# Patient Record
Sex: Male | Born: 1955 | Race: White | Hispanic: No | Marital: Married | Smoking: Former smoker
Health system: Southern US, Community
[De-identification: ages and names within clinical notes are randomized; demographics above are authoritative.]

## PROBLEM LIST (undated history)

## (undated) DIAGNOSIS — G9520 Unspecified cord compression: Secondary | ICD-10-CM

## (undated) DIAGNOSIS — M5416 Radiculopathy, lumbar region: Secondary | ICD-10-CM

## (undated) DIAGNOSIS — K4090 Unilateral inguinal hernia, without obstruction or gangrene, not specified as recurrent: Secondary | ICD-10-CM

## (undated) DIAGNOSIS — Z8739 Personal history of other diseases of the musculoskeletal system and connective tissue: Secondary | ICD-10-CM

## (undated) DIAGNOSIS — I2510 Atherosclerotic heart disease of native coronary artery without angina pectoris: Secondary | ICD-10-CM

## (undated) DIAGNOSIS — R945 Abnormal results of liver function studies: Secondary | ICD-10-CM

## (undated) DIAGNOSIS — R809 Proteinuria, unspecified: Secondary | ICD-10-CM

## (undated) DIAGNOSIS — K635 Polyp of colon: Secondary | ICD-10-CM

## (undated) DIAGNOSIS — I10 Essential (primary) hypertension: Secondary | ICD-10-CM

## (undated) DIAGNOSIS — M48062 Spinal stenosis, lumbar region with neurogenic claudication: Secondary | ICD-10-CM

## (undated) DIAGNOSIS — M5442 Lumbago with sciatica, left side: Secondary | ICD-10-CM

## (undated) DIAGNOSIS — M4800 Spinal stenosis, site unspecified: Secondary | ICD-10-CM

## (undated) DIAGNOSIS — Z9989 Dependence on other enabling machines and devices: Secondary | ICD-10-CM

## (undated) DIAGNOSIS — E559 Vitamin D deficiency, unspecified: Secondary | ICD-10-CM

## (undated) DIAGNOSIS — M5117 Intervertebral disc disorders with radiculopathy, lumbosacral region: Secondary | ICD-10-CM

## (undated) DIAGNOSIS — M25562 Pain in left knee: Secondary | ICD-10-CM

## (undated) DIAGNOSIS — M4712 Other spondylosis with myelopathy, cervical region: Secondary | ICD-10-CM

## (undated) DIAGNOSIS — G8929 Other chronic pain: Secondary | ICD-10-CM

## (undated) DIAGNOSIS — M519 Unspecified thoracic, thoracolumbar and lumbosacral intervertebral disc disorder: Secondary | ICD-10-CM

## (undated) DIAGNOSIS — M1612 Unilateral primary osteoarthritis, left hip: Secondary | ICD-10-CM

## (undated) DIAGNOSIS — M19019 Primary osteoarthritis, unspecified shoulder: Secondary | ICD-10-CM

## (undated) DIAGNOSIS — E119 Type 2 diabetes mellitus without complications: Secondary | ICD-10-CM

## (undated) DIAGNOSIS — G4733 Obstructive sleep apnea (adult) (pediatric): Secondary | ICD-10-CM

## (undated) DIAGNOSIS — E669 Obesity, unspecified: Secondary | ICD-10-CM

## (undated) DIAGNOSIS — D229 Melanocytic nevi, unspecified: Secondary | ICD-10-CM

## (undated) DIAGNOSIS — M47816 Spondylosis without myelopathy or radiculopathy, lumbar region: Secondary | ICD-10-CM

## (undated) DIAGNOSIS — R200 Anesthesia of skin: Secondary | ICD-10-CM

## (undated) DIAGNOSIS — R296 Repeated falls: Secondary | ICD-10-CM

## (undated) DIAGNOSIS — G5603 Carpal tunnel syndrome, bilateral upper limbs: Secondary | ICD-10-CM

## (undated) DIAGNOSIS — E782 Mixed hyperlipidemia: Secondary | ICD-10-CM

## (undated) DIAGNOSIS — N200 Calculus of kidney: Secondary | ICD-10-CM

## (undated) DIAGNOSIS — I6521 Occlusion and stenosis of right carotid artery: Secondary | ICD-10-CM

## (undated) DIAGNOSIS — G629 Polyneuropathy, unspecified: Secondary | ICD-10-CM

## (undated) DIAGNOSIS — R37 Sexual dysfunction, unspecified: Secondary | ICD-10-CM

## (undated) HISTORY — DX: Unilateral primary osteoarthritis, left hip: M16.12

## (undated) HISTORY — DX: Radiculopathy, lumbar region: M54.16

## (undated) HISTORY — DX: Essential (primary) hypertension: I10

## (undated) HISTORY — PX: LUMBAR SPINE SURGERY: SHX701

## (undated) HISTORY — DX: Spondylosis without myelopathy or radiculopathy, lumbar region: M47.816

## (undated) HISTORY — DX: Primary osteoarthritis, unspecified shoulder: M19.019

## (undated) HISTORY — DX: Calculus of kidney: N20.0

## (undated) HISTORY — DX: Type 2 diabetes mellitus without complications: E11.9

## (undated) HISTORY — DX: Obstructive sleep apnea (adult) (pediatric): G47.33

## (undated) HISTORY — DX: Repeated falls: R29.6

## (undated) HISTORY — PX: HEMORRHOID SURGERY: SHX153

## (undated) HISTORY — DX: Spinal stenosis, site unspecified: M48.00

## (undated) HISTORY — DX: Mixed hyperlipidemia: E78.2

## (undated) HISTORY — DX: Lumbago with sciatica, left side: M54.42

## (undated) HISTORY — DX: Other spondylosis with myelopathy, cervical region: M47.12

## (undated) HISTORY — DX: Spinal stenosis, lumbar region with neurogenic claudication: M48.062

## (undated) HISTORY — DX: Occlusion and stenosis of right carotid artery: I65.21

## (undated) HISTORY — PX: CARPAL TUNNEL RELEASE: SHX101

## (undated) HISTORY — DX: Sexual dysfunction, unspecified: R37

## (undated) HISTORY — DX: Polyp of colon: K63.5

## (undated) HISTORY — PX: CERVICAL SPINE SURGERY: SHX589

## (undated) HISTORY — DX: Other chronic pain: G89.29

## (undated) HISTORY — DX: Melanocytic nevi, unspecified: D22.9

## (undated) HISTORY — DX: Unspecified cord compression: G95.20

## (undated) HISTORY — DX: Unspecified thoracic, thoracolumbar and lumbosacral intervertebral disc disorder: M51.9

## (undated) HISTORY — DX: Personal history of other diseases of the musculoskeletal system and connective tissue: Z87.39

## (undated) HISTORY — DX: Carpal tunnel syndrome, bilateral upper limbs: G56.03

## (undated) HISTORY — DX: Unilateral inguinal hernia, without obstruction or gangrene, not specified as recurrent: K40.90

## (undated) HISTORY — PX: PERIPHERAL NEUROPLASTY OF FINGER / HAND / ARM: SUR1022

## (undated) HISTORY — DX: Obesity, unspecified: E66.9

## (undated) HISTORY — DX: Atherosclerotic heart disease of native coronary artery without angina pectoris: I25.10

## (undated) HISTORY — DX: Intervertebral disc disorders with radiculopathy, lumbosacral region: M51.17

## (undated) HISTORY — DX: Abnormal results of liver function studies: R94.5

## (undated) HISTORY — DX: Polyneuropathy, unspecified: G62.9

## (undated) HISTORY — DX: Vitamin D deficiency, unspecified: E55.9

## (undated) HISTORY — DX: Pain in left knee: M25.562

## (undated) HISTORY — DX: Anesthesia of skin: R20.0

## (undated) HISTORY — DX: Dependence on other enabling machines and devices: Z99.89

## (undated) HISTORY — DX: Proteinuria, unspecified: R80.9

## (undated) HISTORY — PX: TONSILLECTOMY: SUR1361

---

## 2013-11-23 ENCOUNTER — Encounter: Payer: Self-pay | Admitting: Cardiology

## 2014-10-04 DIAGNOSIS — M5117 Intervertebral disc disorders with radiculopathy, lumbosacral region: Secondary | ICD-10-CM

## 2014-10-04 HISTORY — DX: Intervertebral disc disorders with radiculopathy, lumbosacral region: M51.17

## 2015-02-06 DIAGNOSIS — R200 Anesthesia of skin: Secondary | ICD-10-CM | POA: Insufficient documentation

## 2015-02-06 DIAGNOSIS — M4712 Other spondylosis with myelopathy, cervical region: Secondary | ICD-10-CM

## 2015-02-06 DIAGNOSIS — G9520 Unspecified cord compression: Secondary | ICD-10-CM | POA: Insufficient documentation

## 2015-02-06 DIAGNOSIS — I119 Hypertensive heart disease without heart failure: Secondary | ICD-10-CM

## 2015-02-06 DIAGNOSIS — I10 Essential (primary) hypertension: Secondary | ICD-10-CM | POA: Insufficient documentation

## 2015-02-06 HISTORY — DX: Anesthesia of skin: R20.0

## 2015-02-06 HISTORY — DX: Hypertensive heart disease without heart failure: I11.9

## 2015-02-06 HISTORY — DX: Other spondylosis with myelopathy, cervical region: M47.12

## 2015-02-06 HISTORY — DX: Essential (primary) hypertension: I10

## 2015-02-06 HISTORY — DX: Unspecified cord compression: G95.20

## 2015-03-13 DIAGNOSIS — E782 Mixed hyperlipidemia: Secondary | ICD-10-CM

## 2015-03-13 HISTORY — DX: Mixed hyperlipidemia: E78.2

## 2015-06-01 DIAGNOSIS — G5603 Carpal tunnel syndrome, bilateral upper limbs: Secondary | ICD-10-CM

## 2015-06-01 HISTORY — DX: Carpal tunnel syndrome, bilateral upper limbs: G56.03

## 2015-07-11 DIAGNOSIS — G8929 Other chronic pain: Secondary | ICD-10-CM | POA: Insufficient documentation

## 2015-07-11 DIAGNOSIS — M5442 Lumbago with sciatica, left side: Secondary | ICD-10-CM

## 2015-07-11 HISTORY — DX: Other chronic pain: G89.29

## 2015-12-25 DIAGNOSIS — M5416 Radiculopathy, lumbar region: Secondary | ICD-10-CM

## 2015-12-25 HISTORY — DX: Radiculopathy, lumbar region: M54.16

## 2016-04-10 DIAGNOSIS — E559 Vitamin D deficiency, unspecified: Secondary | ICD-10-CM

## 2016-04-10 DIAGNOSIS — E119 Type 2 diabetes mellitus without complications: Secondary | ICD-10-CM | POA: Insufficient documentation

## 2016-04-10 DIAGNOSIS — Z9989 Dependence on other enabling machines and devices: Secondary | ICD-10-CM

## 2016-04-10 DIAGNOSIS — G4733 Obstructive sleep apnea (adult) (pediatric): Secondary | ICD-10-CM

## 2016-04-10 HISTORY — DX: Vitamin D deficiency, unspecified: E55.9

## 2016-04-10 HISTORY — DX: Type 2 diabetes mellitus without complications: E11.9

## 2016-04-10 HISTORY — DX: Obstructive sleep apnea (adult) (pediatric): Z99.89

## 2016-04-10 HISTORY — DX: Obstructive sleep apnea (adult) (pediatric): G47.33

## 2016-04-29 DIAGNOSIS — M47816 Spondylosis without myelopathy or radiculopathy, lumbar region: Secondary | ICD-10-CM

## 2016-04-29 HISTORY — DX: Spondylosis without myelopathy or radiculopathy, lumbar region: M47.816

## 2016-10-15 DIAGNOSIS — R296 Repeated falls: Secondary | ICD-10-CM

## 2016-10-15 HISTORY — DX: Repeated falls: R29.6

## 2016-11-08 ENCOUNTER — Other Ambulatory Visit: Payer: Self-pay | Admitting: Orthopedic Surgery

## 2016-11-08 DIAGNOSIS — M4807 Spinal stenosis, lumbosacral region: Secondary | ICD-10-CM

## 2016-11-19 DIAGNOSIS — M1612 Unilateral primary osteoarthritis, left hip: Secondary | ICD-10-CM

## 2016-11-19 HISTORY — DX: Unilateral primary osteoarthritis, left hip: M16.12

## 2016-11-28 ENCOUNTER — Telehealth: Payer: Self-pay

## 2016-11-28 ENCOUNTER — Ambulatory Visit
Admission: RE | Admit: 2016-11-28 | Discharge: 2016-11-28 | Disposition: A | Discharge status: Home / Self Care | Payer: BLUE CROSS/BLUE SHIELD | Source: Ambulatory Visit | Attending: Orthopedic Surgery | Admitting: Orthopedic Surgery

## 2016-11-28 DIAGNOSIS — M4807 Spinal stenosis, lumbosacral region: Secondary | ICD-10-CM

## 2016-11-28 MED ORDER — IOPAMIDOL (ISOVUE-M 200) INJECTION 41%
15.0000 mL | Freq: Once | INTRAMUSCULAR | Status: AC
  Administered 2016-11-28: 15 mL via INTRATHECAL

## 2016-11-28 MED ORDER — DIAZEPAM 5 MG PO TABS
5.0000 mg | ORAL_TABLET | Freq: Once | ORAL | Status: AC
  Administered 2016-11-28: 5 mg via ORAL

## 2016-11-28 NOTE — Discharge Instructions (Signed)
Myelogram Discharge Instructions  1. Go home and rest quietly for the next 24 hours.  It is important to lie flat for the next 24 hours.  Get up only to go to the restroom.  You may lie in the bed or on a couch on your back, your stomach, your left side or your right side.  You may have one pillow under your head.  You may have pillows between your knees while you are on your side or under your knees while you are on your back.  2. DO NOT drive today.  Recline the seat as far back as it will go, while still wearing your seat belt, on the way home.  3. You may get up to go to the bathroom as needed.  You may sit up for 10 minutes to eat.  You may resume your normal diet and medications unless otherwise indicated.  Drink plenty of extra fluids today and tomorrow.  4. The incidence of a spinal headache with nausea and/or vomiting is about 5% (one in 20 patients).  If you develop a headache, lie flat and drink plenty of fluids until the headache goes away.  Caffeinated beverages may be helpful.  If you develop severe nausea and vomiting or a headache that does not go away with flat bed rest, call [phone].  5. You may resume normal activities after your 24 hours of bed rest is over; however, do not exert yourself strongly or do any heavy lifting tomorrow.  6. Call your physician for a follow-up appointment.    You may resume Duloxetine on Friday, November 29, 2016 after 11:30a.m.

## 2016-12-21 DIAGNOSIS — K635 Polyp of colon: Secondary | ICD-10-CM

## 2016-12-21 DIAGNOSIS — I6521 Occlusion and stenosis of right carotid artery: Secondary | ICD-10-CM

## 2016-12-21 DIAGNOSIS — N200 Calculus of kidney: Secondary | ICD-10-CM

## 2016-12-21 DIAGNOSIS — I2510 Atherosclerotic heart disease of native coronary artery without angina pectoris: Secondary | ICD-10-CM

## 2016-12-21 DIAGNOSIS — M48062 Spinal stenosis, lumbar region with neurogenic claudication: Secondary | ICD-10-CM

## 2016-12-21 DIAGNOSIS — B351 Tinea unguium: Secondary | ICD-10-CM | POA: Insufficient documentation

## 2016-12-21 DIAGNOSIS — M19019 Primary osteoarthritis, unspecified shoulder: Secondary | ICD-10-CM

## 2016-12-21 DIAGNOSIS — M4800 Spinal stenosis, site unspecified: Secondary | ICD-10-CM | POA: Insufficient documentation

## 2016-12-21 DIAGNOSIS — D229 Melanocytic nevi, unspecified: Secondary | ICD-10-CM

## 2016-12-21 HISTORY — DX: Spinal stenosis, lumbar region with neurogenic claudication: M48.062

## 2016-12-21 HISTORY — DX: Calculus of kidney: N20.0

## 2016-12-21 HISTORY — DX: Primary osteoarthritis, unspecified shoulder: M19.019

## 2016-12-21 HISTORY — DX: Atherosclerotic heart disease of native coronary artery without angina pectoris: I25.10

## 2016-12-21 HISTORY — DX: Polyp of colon: K63.5

## 2016-12-21 HISTORY — DX: Melanocytic nevi, unspecified: D22.9

## 2016-12-21 HISTORY — DX: Spinal stenosis, site unspecified: M48.00

## 2016-12-21 HISTORY — DX: Occlusion and stenosis of right carotid artery: I65.21

## 2016-12-23 DIAGNOSIS — K4090 Unilateral inguinal hernia, without obstruction or gangrene, not specified as recurrent: Secondary | ICD-10-CM

## 2016-12-23 HISTORY — DX: Unilateral inguinal hernia, without obstruction or gangrene, not specified as recurrent: K40.90

## 2017-07-08 DIAGNOSIS — M25562 Pain in left knee: Secondary | ICD-10-CM | POA: Insufficient documentation

## 2017-07-08 HISTORY — DX: Pain in left knee: M25.562

## 2017-08-19 DIAGNOSIS — G629 Polyneuropathy, unspecified: Secondary | ICD-10-CM

## 2017-08-19 HISTORY — DX: Polyneuropathy, unspecified: G62.9

## 2017-08-22 DIAGNOSIS — R945 Abnormal results of liver function studies: Secondary | ICD-10-CM

## 2017-08-22 DIAGNOSIS — R809 Proteinuria, unspecified: Secondary | ICD-10-CM | POA: Insufficient documentation

## 2017-08-22 DIAGNOSIS — R37 Sexual dysfunction, unspecified: Secondary | ICD-10-CM

## 2017-08-22 DIAGNOSIS — E669 Obesity, unspecified: Secondary | ICD-10-CM

## 2017-08-22 DIAGNOSIS — Z8739 Personal history of other diseases of the musculoskeletal system and connective tissue: Secondary | ICD-10-CM

## 2017-08-22 DIAGNOSIS — R7989 Other specified abnormal findings of blood chemistry: Secondary | ICD-10-CM

## 2017-08-22 DIAGNOSIS — M519 Unspecified thoracic, thoracolumbar and lumbosacral intervertebral disc disorder: Secondary | ICD-10-CM

## 2017-08-22 HISTORY — DX: Proteinuria, unspecified: R80.9

## 2017-08-22 HISTORY — DX: Other specified abnormal findings of blood chemistry: R79.89

## 2017-08-22 HISTORY — DX: Personal history of other diseases of the musculoskeletal system and connective tissue: Z87.39

## 2017-08-22 HISTORY — DX: Obesity, unspecified: E66.9

## 2017-08-22 HISTORY — DX: Unspecified thoracic, thoracolumbar and lumbosacral intervertebral disc disorder: M51.9

## 2017-08-22 HISTORY — DX: Sexual dysfunction, unspecified: R37

## 2017-09-08 NOTE — Progress Notes (Signed)
Cardiology Office Note:    Date:  09/09/2017   ID:  [name], DOB Nov 02, 1955, MRN [id number]  PCP:  Alma Friendly, MD  Cardiologist:  Shirlee More, MD   Referring MD: [facility]*  ASSESSMENT:    1. Coronary artery disease of native artery of native heart with stable angina pectoris ([place])   2. Hypertensive heart disease without heart failure   3. Aortic stenosis, mild   4. Carotid stenosis, right    PLAN:    In order of problems listed above:  1. I am unsure if he has an established diagnosis of CAD there is a disconnect between the records at [facility] patient and family and there is no record of coronary angiography or myocardial perfusion study at this time.  He is not having chest pain I asked him to continue aspirin statin and his current antihypertensives. 2. Stable blood pressure target continue his current ACE inhibitor diuretic 3. Physical examination suggest progression echocardiogram ordered. 4. Asymptomatic I asked him to add aspirin to his statin continue antihypertensives encouraged him to follow through and see vascular surgery  Next appointment 6 months   Medication Adjustments/Labs and Tests Ordered: Current medicines are reviewed at length with the patient today.  Concerns regarding medicines are outlined above.  Orders Placed This Encounter  Procedures  . EKG 12-Lead  . ECHOCARDIOGRAM COMPLETE   Meds ordered this encounter  Medications  . aspirin EC 81 MG tablet    Sig: Take 1 tablet (81 mg total) by mouth daily.    Dispense:  30 tablet    Refill:  3     Chief Complaint  Patient presents with  . Fatigue    History of Present Illness:    Zachary Andrews is a 62 y.o. male who is being seen today for the evaluation of heart disease at the request of [place]*. I reviewed the note from his primary care physician last month he had seen me many years ago and there is a question of whether he has coronary artery  disease.  He was seen by me at [facility] in September 2015.  At that time he troponin normal unstable angina pectoris and referred for coronary angiography report is unavailable.  His echocardiogram showed moderate left ventricular hypertrophy and trivial aortic stenosis and a carotid duplex showed greater than 70% right internal carotid artery stenosis.  His medical problems including hypertension hyperlipidemia and type 2 diabetes I am at a loss to reconcile the hospital records to his memory.  He tells me that he did not undergo heart catheterization or stress test and was simply discharged from the hospital.  Since then he has had no cardiac complaints no recurrent chest pain he had a recent carotid duplex showing severe stenosis of the right internal carotid artery is scheduled to be seen by vascular surgery [facility].  He is severely limited with low back pain walking perhaps 50-100 foot he has shortness of breath but he thinks is due to his severe back pain.  He has had no chest pain palpitations syncope or TIA.  He had been on aspirin recently discontinued because of bruising  Past Medical History:  Diagnosis Date  . Atypical nevi 12/21/2016  . Carpal tunnel syndrome, bilateral 06/01/2015  . Central spinal stenosis 12/21/2016  . Chronic bilateral low back pain with left-sided sciatica 07/11/2015  . Coronary artery disease involving native coronary artery of native heart 12/21/2016  . Elevated LFTs 08/22/2017  ICD-10 cut over  . Frequent falls 10/15/2016  . H/O spinal stenosis 08/22/2017  . Hypertension 02/06/2015  . 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 Lumbar disc disease 08/22/2017  . Lumbar radiculopathy 12/25/2015  . Lumbar spondylosis 04/29/2016  . Lumbar stenosis with neurogenic claudication 12/21/2016  . Microalbuminuria 08/22/2017  . Mixed hyperlipidemia 03/13/2015   Refusing statin  . Nephrolithiasis 12/21/2016  .  Neuropathy 08/19/2017   From cervical and lumbar spinal stenosis  And carpal tunnel  . Non-recurrent unilateral inguinal hernia without obstruction or gangrene 12/23/2016   2018  . Numbness 02/06/2015  . Obesity 08/22/2017  . Obstructive sleep apnea on CPAP 04/10/2016   Managed PULM  . Occlusion of right internal carotid artery 12/21/2016   >70%  . 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  . Osteoarthritis of shoulder 12/21/2016  . Pain in left knee 07/08/2017  . Polyp of colon 12/21/2016  . Primary osteoarthritis of left hip 11/19/2016  . Sexual dysfunction 08/22/2017  . Spinal cord compression ([place]) 02/06/2015  . Type 2 diabetes mellitus without complication, without long-term current use of insulin ([place]) 04/10/2016  . Vitamin D deficiency 04/10/2016    Past Surgical History:  Procedure Laterality Date  . CARPAL TUNNEL RELEASE Bilateral   . CERVICAL SPINE SURGERY    . HEMORRHOID SURGERY    . LUMBAR SPINE SURGERY     scraped nerves  . PERIPHERAL NEUROPLASTY OF FINGER / HAND / ARM Bilateral    transposition median nerve at carpal tunnel  . TONSILLECTOMY      Current Medications: Current Meds  Medication Sig  . Ascorbic Acid (VITAMIN C) 1000 MG tablet Take 2,000 mg by mouth daily.  Marland Kitchen lisinopril-hydrochlorothiazide (PRINZIDE,ZESTORETIC) 20-12.5 MG tablet Take 1 tablet by mouth daily.  . simvastatin (ZOCOR) 40 MG tablet Take 40 mg by mouth at bedtime.  . Vitamin D, Ergocalciferol, (DRISDOL) 50000 units CAPS capsule Take 50,000 Units by mouth every 7 (seven) days.     Allergies:   Bee venom; Atorvastatin; and Gabapentin   Social History   Socioeconomic History  . Marital status: Married    Spouse name: Not on file  . Number of children: Not on file  . Years of education: Not on file  . Highest education level: Not on file  Occupational History  . Not on file  Social Needs  . Financial resource strain: Not on file  . Food insecurity:    Worry: Not on  file    Inability: Not on file  . Transportation needs:    Medical: Not on file    Non-medical: Not on file  Tobacco Use  . Smoking status: Former Smoker    Packs/day: 1.00    Types: Cigarettes    Last attempt to quit: 11/25/2012    Years since quitting: 4.7  . Smokeless tobacco: Never Used  Substance and Sexual Activity  . Alcohol use: Not Currently  . Drug use: Not Currently  . Sexual activity: Not on file  Lifestyle  . Physical activity:    Days per week: Not on file    Minutes per session: Not on file  . Stress: Not on file  Relationships  . Social connections:    Talks on phone: Not on file    Gets together: Not on file    Attends religious service: Not on file    Active member of club or organization: Not on file    Attends meetings of clubs or organizations:  Not on file    Relationship status: Not on file  Other Topics Concern  . Not on file  Social History Narrative  . Not on file     Family History: The patient's family history includes Bone cancer in his mother; Congestive Heart Failure in his mother; Heart attack in his father; Leukemia in his brother.  ROS:   Review of Systems  Constitution: Positive for malaise/fatigue.  HENT: Negative.   Eyes: Negative.   Cardiovascular: Positive for dyspnea on exertion.  Respiratory: Positive for shortness of breath (he feels it is due to severe exertional back pain).   Endocrine: Negative.   Hematologic/Lymphatic: Negative.   Skin: Negative.   Musculoskeletal: Positive for back pain.  Gastrointestinal: Negative.   Genitourinary: Negative.   Neurological: Positive for dizziness.  Psychiatric/Behavioral: Negative.   Allergic/Immunologic: Negative.    Please see the history of present illness.     All other systems reviewed and are negative.  EKGs/Labs/Other Studies Reviewed:    The following studies were reviewed today:  EKG 02/22/2015 sinus rhythm and normal EKG:  EKG is  ordered today.  The ekg ordered  today demonstrates [facility] Chest x-ray 08/19/2017, no active disease  recent Labs: No results found for requested labs within last 8760 hours.  Recent Lipid Panel No results found for: CHOL, TRIG, HDL, CHOLHDL, VLDL, LDLCALC, LDLDIRECT  Physical Exam:    VS:  BP 102/62 (BP Location: Right Arm, Patient Position: Sitting, Cuff Size: Large)   Pulse 74   Ht 6\' 2"  (1.88 m)   Wt (!) 321 lb (145.6 kg)   SpO2 97%   BMI 41.21 kg/m     Wt Readings from Last 3 Encounters:  09/09/17 (!) 321 lb (145.6 kg)  11/11/16 (!) 310 lb (140.6 kg)     GEN:  Well nourished, well developed in no acute distress HEENT: Normal NECK: No JVD; No carotid bruits LYMPHATICS: No lymphadenopathy CARDIAC: 2/6 SEM harsh to the R clavicle S2 intactRRR, no murmurs, rubs, gallops RESPIRATORY:  Clear to auscultation without rales, wheezing or rhonchi  ABDOMEN: Soft, non-tender, non-distended MUSCULOSKELETAL:  No edema; No deformity  SKIN: Warm and dry NEUROLOGIC:  Alert and oriented x 3 PSYCHIATRIC:  Normal affect     Signed, Shirlee More, MD  09/09/2017 2:29 PM    [place]

## 2017-09-09 ENCOUNTER — Encounter: Payer: Self-pay | Admitting: Cardiology

## 2017-09-09 ENCOUNTER — Ambulatory Visit (INDEPENDENT_AMBULATORY_CARE_PROVIDER_SITE_OTHER): Payer: Medicaid Other | Admitting: Cardiology

## 2017-09-09 VITALS — BP 102/62 | HR 74 | Ht 74.0 in | Wt 321.0 lb

## 2017-09-09 DIAGNOSIS — I25118 Atherosclerotic heart disease of native coronary artery with other forms of angina pectoris: Secondary | ICD-10-CM

## 2017-09-09 DIAGNOSIS — I350 Nonrheumatic aortic (valve) stenosis: Secondary | ICD-10-CM | POA: Insufficient documentation

## 2017-09-09 DIAGNOSIS — I119 Hypertensive heart disease without heart failure: Secondary | ICD-10-CM | POA: Diagnosis not present

## 2017-09-09 DIAGNOSIS — I6521 Occlusion and stenosis of right carotid artery: Secondary | ICD-10-CM

## 2017-09-09 DIAGNOSIS — M47818 Spondylosis without myelopathy or radiculopathy, sacral and sacrococcygeal region: Secondary | ICD-10-CM | POA: Insufficient documentation

## 2017-09-09 DIAGNOSIS — M461 Sacroiliitis, not elsewhere classified: Secondary | ICD-10-CM

## 2017-09-09 HISTORY — DX: Nonrheumatic aortic (valve) stenosis: I35.0

## 2017-09-09 HISTORY — DX: Sacroiliitis, not elsewhere classified: M46.1

## 2017-09-09 HISTORY — DX: Occlusion and stenosis of right carotid artery: I65.21

## 2017-09-09 MED ORDER — ASPIRIN EC 81 MG PO TBEC: 81.0000 mg | DELAYED_RELEASE_TABLET | Freq: Every day | ORAL | 3 refills | Status: DC

## 2017-09-09 NOTE — Patient Instructions (Signed)
Medication Instructions:  Your physician has recommended you make the following change in your medication:   START: Aspirin enteric coated 81mg  take one tablet daily   Labwork: NONE  Testing/Procedures: You had an EKG today  Your physician has requested that you have an echocardiogram. Echocardiography is a painless test that uses sound waves to create images of your heart. It provides your doctor with information about the size and shape of your heart and how well your heart's chambers and valves are working. This procedure takes approximately one hour. There are no restrictions for this procedure.    Follow-Up: Your physician wants you to follow-up in: 6 months. You will receive a reminder letter in the mail two months in advance. If you don't receive a letter, please call our office to schedule the follow-up appointment.   Any Other Special Instructions Will Be Listed Below (If Applicable).     If you need a refill on your cardiac medications before your next appointment, please call your pharmacy.

## 2017-09-10 DIAGNOSIS — N182 Chronic kidney disease, stage 2 (mild): Secondary | ICD-10-CM | POA: Insufficient documentation

## 2017-09-10 HISTORY — DX: Chronic kidney disease, stage 2 (mild): N18.2

## 2017-10-09 ENCOUNTER — Other Ambulatory Visit: Payer: Medicaid Other

## 2018-02-06 ENCOUNTER — Other Ambulatory Visit: Payer: Self-pay | Admitting: Orthopedic Surgery

## 2018-02-06 ENCOUNTER — Telehealth: Payer: Self-pay

## 2018-02-06 DIAGNOSIS — G8929 Other chronic pain: Secondary | ICD-10-CM

## 2018-02-06 DIAGNOSIS — M545 Low back pain, unspecified: Secondary | ICD-10-CM

## 2018-02-06 NOTE — Telephone Encounter (Signed)
Spoke with patient to screen his medications and drug allergies.  No medications to stop.  Brita Romp, RN

## 2018-02-19 ENCOUNTER — Ambulatory Visit
Admission: RE | Admit: 2018-02-19 | Discharge: 2018-02-19 | Disposition: A | Discharge status: Home / Self Care | Payer: Medicaid Other | Source: Ambulatory Visit | Attending: Orthopedic Surgery | Admitting: Orthopedic Surgery

## 2018-02-19 DIAGNOSIS — M545 Low back pain, unspecified: Secondary | ICD-10-CM

## 2018-02-19 DIAGNOSIS — G8929 Other chronic pain: Secondary | ICD-10-CM

## 2018-02-19 MED ORDER — ONDANSETRON HCL 4 MG/2ML IJ SOLN: 4.0000 mg | Freq: Four times a day (QID) | INTRAMUSCULAR | Status: DC | PRN

## 2018-02-19 MED ORDER — DIAZEPAM 5 MG PO TABS
10.0000 mg | ORAL_TABLET | Freq: Once | ORAL | Status: AC
  Administered 2018-02-19: 5 mg via ORAL

## 2018-02-19 MED ORDER — IOPAMIDOL (ISOVUE-M 200) INJECTION 41%
15.0000 mL | Freq: Once | INTRAMUSCULAR | Status: AC
  Administered 2018-02-19: 15 mL via INTRATHECAL

## 2018-02-19 NOTE — Discharge Instructions (Signed)

## 2018-04-10 DIAGNOSIS — H5347 Heteronymous bilateral field defects: Secondary | ICD-10-CM

## 2018-04-10 HISTORY — DX: Heteronymous bilateral field defects: H53.47

## 2018-04-20 DIAGNOSIS — H903 Sensorineural hearing loss, bilateral: Secondary | ICD-10-CM

## 2018-04-20 DIAGNOSIS — H9313 Tinnitus, bilateral: Secondary | ICD-10-CM

## 2018-04-20 HISTORY — DX: Sensorineural hearing loss, bilateral: H90.3

## 2018-04-20 HISTORY — DX: Tinnitus, bilateral: H93.13

## 2019-01-12 DIAGNOSIS — R0989 Other specified symptoms and signs involving the circulatory and respiratory systems: Secondary | ICD-10-CM

## 2019-01-12 HISTORY — DX: Other specified symptoms and signs involving the circulatory and respiratory systems: R09.89

## 2019-04-11 NOTE — Progress Notes (Deleted)
Cardiology Office Note:    Date:  04/11/2019   ID:  Zachary Andrews, DOB May 30, 1955, MRN [id number]  PCP:  Alma Friendly, MD  Cardiologist:  Shirlee More, MD    Referring MD: Alma Friendly, MD    ASSESSMENT:    No diagnosis found. PLAN:    In order of problems listed above:  1. ***   Next appointment: ***   Medication Adjustments/Labs and Tests Ordered: Current medicines are reviewed at length with the patient today.  Concerns regarding medicines are outlined above.  No orders of the defined types were placed in this encounter.  No orders of the defined types were placed in this encounter.   No chief complaint on file.   History of Present Illness:    Zachary Andrews is a 64 y.o. male with a hx of CAD mild AS hypertension and R ICA stenosis.He was last seen 09/09/2017.He was seen by me at [facility] in September 2015.  At that time he troponin normal unstable angina pectoris and referred for coronary angiography report is unavailable.  His echocardiogram showed moderate left ventricular hypertrophy and trivial aortic stenosis and a carotid duplex showed greater than 70% right internal carotid artery stenosis.  His medical problems including hypertension hyperlipidemia and type 2 diabetes   Compliance with diet, lifestyle and medications: *** Past Medical History:  Diagnosis Date  . Atypical nevi 12/21/2016  . Carpal tunnel syndrome, bilateral 06/01/2015  . Central spinal stenosis 12/21/2016  . Chronic bilateral low back pain with left-sided sciatica 07/11/2015  . Coronary artery disease involving native coronary artery of native heart 12/21/2016  . Elevated LFTs 08/22/2017   ICD-10 cut over  . Frequent falls 10/15/2016  . H/O spinal stenosis 08/22/2017  . Hypertension 02/06/2015  . 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 Lumbar disc disease 08/22/2017  . Lumbar radiculopathy 12/25/2015  . Lumbar spondylosis 04/29/2016  .  Lumbar stenosis with neurogenic claudication 12/21/2016  . Microalbuminuria 08/22/2017  . Mixed hyperlipidemia 03/13/2015   Refusing statin  . Nephrolithiasis 12/21/2016  . Neuropathy 08/19/2017   From cervical and lumbar spinal stenosis  And carpal tunnel  . Non-recurrent unilateral inguinal hernia without obstruction or gangrene 12/23/2016   2018  . Numbness 02/06/2015  . Obesity 08/22/2017  . Obstructive sleep apnea on CPAP 04/10/2016   Managed PULM  . Occlusion of right internal carotid artery 12/21/2016   >70%  . 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  . Osteoarthritis of shoulder 12/21/2016  . Pain in left knee 07/08/2017  . Polyp of colon 12/21/2016  . Primary osteoarthritis of left hip 11/19/2016  . Sexual dysfunction 08/22/2017  . Spinal cord compression ([place]) 02/06/2015  . Type 2 diabetes mellitus without complication, without long-term current use of insulin ([place]) 04/10/2016  . Vitamin D deficiency 04/10/2016    Past Surgical History:  Procedure Laterality Date  . CARPAL TUNNEL RELEASE Bilateral   . CERVICAL SPINE SURGERY    . HEMORRHOID SURGERY    . LUMBAR SPINE SURGERY     scraped nerves  . PERIPHERAL NEUROPLASTY OF FINGER / HAND / ARM Bilateral    transposition median nerve at carpal tunnel  . TONSILLECTOMY      Current Medications: No outpatient medications have been marked as taking for the 04/13/19 encounter (Appointment) with Richardo Priest, MD.     Allergies:   Bee venom, Atorvastatin, and Gabapentin   Social History   Socioeconomic History  . Marital status: Married  Spouse name: Not on file  . Number of children: Not on file  . Years of education: Not on file  . Highest education level: Not on file  Occupational History  . Not on file  Tobacco Use  . Smoking status: Former Smoker    Packs/day: 1.00    Types: Cigarettes    Quit date: 11/25/2012    Years since quitting: 6.3  . Smokeless tobacco: Never Used  Substance and  Sexual Activity  . Alcohol use: Not Currently  . Drug use: Not Currently  . Sexual activity: Not on file  Other Topics Concern  . Not on file  Social History Narrative  . Not on file   Social Determinants of Health   Financial Resource Strain:   . Difficulty of Paying Living Expenses: Not on file  Food Insecurity:   . Worried About Charity fundraiser in the Last Year: Not on file  . Ran Out of Food in the Last Year: Not on file  Transportation Needs:   . Lack of Transportation (Medical): Not on file  . Lack of Transportation (Non-Medical): Not on file  Physical Activity:   . Days of Exercise per Week: Not on file  . Minutes of Exercise per Session: Not on file  Stress:   . Feeling of Stress : Not on file  Social Connections:   . Frequency of Communication with Friends and Family: Not on file  . Frequency of Social Gatherings with Friends and Family: Not on file  . Attends Religious Services: Not on file  . Active Member of Clubs or Organizations: Not on file  . Attends Archivist Meetings: Not on file  . Marital Status: Not on file     Family History: The patient's ***family history includes Bone cancer in his mother; Congestive Heart Failure in his mother; Heart attack in his father; Leukemia in his brother. ROS:   Please see the history of present illness.    All other systems reviewed and are negative.  EKGs/Labs/Other Studies Reviewed:    The following studies were reviewed today:  EKG:  EKG ordered today and personally reviewed.  The ekg ordered today demonstrates ***  Recent Labs: No results found for requested labs within last 8760 hours.  Recent Lipid Panel No results found for: CHOL, TRIG, HDL, CHOLHDL, VLDL, LDLCALC, LDLDIRECT  Physical Exam:    VS:  There were no vitals taken for this visit.    Wt Readings from Last 3 Encounters:  09/09/17 (!) 321 lb (145.6 kg)  11/11/16 (!) 310 lb (140.6 kg)     GEN: *** Well nourished, well developed  in no acute distress HEENT: Normal NECK: No JVD; No carotid bruits LYMPHATICS: No lymphadenopathy CARDIAC: ***RRR, no murmurs, rubs, gallops RESPIRATORY:  Clear to auscultation without rales, wheezing or rhonchi  ABDOMEN: Soft, non-tender, non-distended MUSCULOSKELETAL:  No edema; No deformity  SKIN: Warm and dry NEUROLOGIC:  Alert and oriented x 3 PSYCHIATRIC:  Normal affect    Signed, Shirlee More, MD  04/11/2019 2:17 PM    [facility]

## 2019-04-13 ENCOUNTER — Ambulatory Visit: Payer: Medicaid Other | Admitting: Cardiology

## 2019-05-23 NOTE — Progress Notes (Deleted)
Cardiology Office Note:    Date:  05/23/2019   ID:  Zachary Andrews, DOB 1955/03/14, MRN [id number]  PCP:  Alma Friendly, MD  Cardiologist:  Shirlee More, MD    Referring MD: Alma Friendly, MD    ASSESSMENT:    No diagnosis found. PLAN:    In order of problems listed above:  1. ***   Next appointment: ***   Medication Adjustments/Labs and Tests Ordered: Current medicines are reviewed at length with the patient today.  Concerns regarding medicines are outlined above.  No orders of the defined types were placed in this encounter.  No orders of the defined types were placed in this encounter.   No chief complaint on file.   History of Present Illness:    Zachary Andrews is a 64 y.o. male with a hx of chest pain last seen 09/09/2017.He was seen by me at [facility] in September 2015. At that time he troponin normal unstable angina pectoris and referred for coronary angiography report is unavailable. His echocardiogram showed moderate left ventricular hypertrophy and trivial aortic stenosis and a carotid duplex showed greater than 70% right internal carotid artery stenosis. His medical problems including hypertension hyperlipidemia and type 2 diabetes   Compliance with diet, lifestyle and medications: *** Past Medical History:  Diagnosis Date  . Atypical nevi 12/21/2016  . Carpal tunnel syndrome, bilateral 06/01/2015  . Central spinal stenosis 12/21/2016  . Chronic bilateral low back pain with left-sided sciatica 07/11/2015  . Coronary artery disease involving native coronary artery of native heart 12/21/2016  . Elevated LFTs 08/22/2017   ICD-10 cut over  . Frequent falls 10/15/2016  . H/O spinal stenosis 08/22/2017  . Hypertension 02/06/2015  . 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 Lumbar disc disease 08/22/2017  . Lumbar radiculopathy 12/25/2015  . Lumbar spondylosis 04/29/2016  . Lumbar stenosis with neurogenic claudication  12/21/2016  . Microalbuminuria 08/22/2017  . Mixed hyperlipidemia 03/13/2015   Refusing statin  . Nephrolithiasis 12/21/2016  . Neuropathy 08/19/2017   From cervical and lumbar spinal stenosis  And carpal tunnel  . Non-recurrent unilateral inguinal hernia without obstruction or gangrene 12/23/2016   2018  . Numbness 02/06/2015  . Obesity 08/22/2017  . Obstructive sleep apnea on CPAP 04/10/2016   Managed PULM  . Occlusion of right internal carotid artery 12/21/2016   >70%  . 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  . Osteoarthritis of shoulder 12/21/2016  . Pain in left knee 07/08/2017  . Polyp of colon 12/21/2016  . Primary osteoarthritis of left hip 11/19/2016  . Sexual dysfunction 08/22/2017  . Spinal cord compression ([place]) 02/06/2015  . Type 2 diabetes mellitus without complication, without long-term current use of insulin ([place]) 04/10/2016  . Vitamin D deficiency 04/10/2016    Past Surgical History:  Procedure Laterality Date  . CARPAL TUNNEL RELEASE Bilateral   . CERVICAL SPINE SURGERY    . HEMORRHOID SURGERY    . LUMBAR SPINE SURGERY     scraped nerves  . PERIPHERAL NEUROPLASTY OF FINGER / HAND / ARM Bilateral    transposition median nerve at carpal tunnel  . TONSILLECTOMY      Current Medications: No outpatient medications have been marked as taking for the 05/24/19 encounter (Appointment) with Richardo Priest, MD.     Allergies:   Bee venom, Atorvastatin calcium, Metformin, Pravastatin, Rosuvastatin, Atorvastatin, and Gabapentin   Social History   Socioeconomic History  . Marital status: Married    Spouse name: Not  on file  . Number of children: Not on file  . Years of education: Not on file  . Highest education level: Not on file  Occupational History  . Not on file  Tobacco Use  . Smoking status: Former Smoker    Packs/day: 1.00    Types: Cigarettes    Quit date: 11/25/2012    Years since quitting: 6.4  . Smokeless tobacco: Never Used    Substance and Sexual Activity  . Alcohol use: Not Currently  . Drug use: Not Currently  . Sexual activity: Not on file  Other Topics Concern  . Not on file  Social History Narrative  . Not on file   Social Determinants of Health   Financial Resource Strain:   . Difficulty of Paying Living Expenses:   Food Insecurity:   . Worried About Charity fundraiser in the Last Year:   . Arboriculturist in the Last Year:   Transportation Needs:   . Film/video editor (Medical):   Marland Kitchen Lack of Transportation (Non-Medical):   Physical Activity:   . Days of Exercise per Week:   . Minutes of Exercise per Session:   Stress:   . Feeling of Stress :   Social Connections:   . Frequency of Communication with Friends and Family:   . Frequency of Social Gatherings with Friends and Family:   . Attends Religious Services:   . Active Member of Clubs or Organizations:   . Attends Archivist Meetings:   Marland Kitchen Marital Status:      Family History: The patient's ***family history includes Bone cancer in his mother; Congestive Heart Failure in his mother; Heart attack in his father; Leukemia in his brother. ROS:   Please see the history of present illness.    All other systems reviewed and are negative.  EKGs/Labs/Other Studies Reviewed:    The following studies were reviewed today:  EKG:  EKG ordered today and personally reviewed.  The ekg ordered today demonstrates ***  Recent Labs: No results found for requested labs within last 8760 hours.  Recent Lipid Panel No results found for: CHOL, TRIG, HDL, CHOLHDL, VLDL, LDLCALC, LDLDIRECT  Physical Exam:    VS:  There were no vitals taken for this visit.    Wt Readings from Last 3 Encounters:  09/09/17 (!) 321 lb (145.6 kg)  11/11/16 (!) 310 lb (140.6 kg)     GEN: *** Well nourished, well developed in no acute distress HEENT: Normal NECK: No JVD; No carotid bruits LYMPHATICS: No lymphadenopathy CARDIAC: ***RRR, no murmurs, rubs,  gallops RESPIRATORY:  Clear to auscultation without rales, wheezing or rhonchi  ABDOMEN: Soft, non-tender, non-distended MUSCULOSKELETAL:  No edema; No deformity  SKIN: Warm and dry NEUROLOGIC:  Alert and oriented x 3 PSYCHIATRIC:  Normal affect    Signed, Shirlee More, MD  05/23/2019 6:38 PM    [facility]

## 2019-05-24 ENCOUNTER — Ambulatory Visit: Payer: Medicaid Other | Admitting: Cardiology

## 2019-06-14 NOTE — Progress Notes (Signed)
Cardiology Office Note:    Date:  06/15/2019   ID:  Zachary Andrews, DOB 01/03/1956, MRN [id number]  PCP:  Algis Greenhouse, MD  Cardiologist:  Shirlee More, MD    Referring MD: Alma Friendly, MD    ASSESSMENT:    1. Coronary artery disease of native artery of native heart with stable angina pectoris ([place])   2. Hypertensive heart disease without heart failure   3. Carotid stenosis, right   4. Aortic stenosis, mild   5. Mixed hyperlipidemia    PLAN:    In order of problems listed above:  1. Stable CAD at this time do not think it requires an ischemia evaluation continue aspirin antihypertensive and switch to PCSK9 inhibitors absolutely statin intolerant. 2. Worsened he is taking SGLT2 inhibitor and is noticed his blood pressures are 110 or less and is lightheaded will reduce his antihypertensive by 50% 3. Vascular surgery has occlusion of his right internal carotid artery 4. Echocardiogram ordered with aortic stenosis by diabetes progressed by physical exam 5. Trial of PCSK9   Next appointment: 3 months   Medication Adjustments/Labs and Tests Ordered: Current medicines are reviewed at length with the patient today.  Concerns regarding medicines are outlined above.  No orders of the defined types were placed in this encounter.  No orders of the defined types were placed in this encounter.   Chief Complaint  Patient presents with  . Follow-up    Carotid artery stenosis  . Coronary Artery Disease  . Aortic Stenosis    History of Present Illness:    Zachary Andrews is a 64 y.o. male with a hx of CAD and carotid stenosis last seen 09/09/2017.  Compliance with diet, lifestyle and medications: Yes  He relates to me he had no intervention done at the time of his coronary angiogram and is not having angina edema orthopnea palpitation or syncope.  He struggles walking especially uphill and relates much of it to muscle pain neuropathy and chronic back pain.  He does have exertional  shortness of breath but is not limiting.  He is intolerant of simvastatin and tolerates none of the statins and is taking over-the-counter red rice yeast for the last week.  We discussed PCSK9 inhibitor he is interested in we will place him on Repatha as he is absolutely statin intolerant and very high risk with this vascular disease.  He was seen by me at [facility] in September 2015.  At that time he troponin normal unstable angina pectoris and referred for coronary angiography report is unavailable.  His echocardiogram showed moderate left ventricular hypertrophy and trivial aortic stenosis and a carotid duplex showed greater than 70% right internal carotid artery stenosis.  He has had a right carotid endarterectomy. His medical problems including hypertension hyperlipidemia and type 2 diabetes.  He also has right internal carotid artery stenosis followed by vascular surgery.  Vascular duplex 01/08/2019: Result Impression  Procedure: This exam consists of 2D gray scale imaging, color Doppler,  and pulsed wave Doppler analysis of the carotid arteries. . Indication: Follow up s/p right carotid endarterectomy and known  occluded on 01/08/2018. Marland Kitchen Previous: . Right: No audible flow in the ICA, occluded. Increased velocities in  the ECA, PSV 345.72/EDV42.65. Marland Kitchen Left: No significant focal increase in ICA velocities.  Conclusions:  Right: The internal carotid artery is occluded, S/P CEA. Increased  velocities in the ECA. Left: No hemodynamically significant ICA stenosis, consistent with <60%.  Compared to previous exam, these results remain essentially unchanged.  Past Medical History:  Diagnosis Date  . Atypical nevi 12/21/2016  . Carpal tunnel syndrome, bilateral 06/01/2015  . Central spinal stenosis 12/21/2016  . Chronic bilateral low back pain with left-sided sciatica 07/11/2015  . Coronary artery disease involving native coronary artery of native heart 12/21/2016  . Elevated  LFTs 08/22/2017   ICD-10 cut over  . Frequent falls 10/15/2016  . H/O spinal stenosis 08/22/2017  . Hypertension 02/06/2015  . 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 Lumbar disc disease 08/22/2017  . Lumbar radiculopathy 12/25/2015  . Lumbar spondylosis 04/29/2016  . Lumbar stenosis with neurogenic claudication 12/21/2016  . Microalbuminuria 08/22/2017  . Mixed hyperlipidemia 03/13/2015   Refusing statin  . Nephrolithiasis 12/21/2016  . Neuropathy 08/19/2017   From cervical and lumbar spinal stenosis  And carpal tunnel  . Non-recurrent unilateral inguinal hernia without obstruction or gangrene 12/23/2016   2018  . Numbness 02/06/2015  . Obesity 08/22/2017  . Obstructive sleep apnea on CPAP 04/10/2016   Managed PULM  . Occlusion of right internal carotid artery 12/21/2016   >70%  . 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  . Osteoarthritis of shoulder 12/21/2016  . Pain in left knee 07/08/2017  . Polyp of colon 12/21/2016  . Primary osteoarthritis of left hip 11/19/2016  . Sexual dysfunction 08/22/2017  . Spinal cord compression ([place]) 02/06/2015  . Type 2 diabetes mellitus without complication, without long-term current use of insulin ([place]) 04/10/2016  . Vitamin D deficiency 04/10/2016    Past Surgical History:  Procedure Laterality Date  . CARPAL TUNNEL RELEASE Bilateral   . CERVICAL SPINE SURGERY    . HEMORRHOID SURGERY    . LUMBAR SPINE SURGERY     scraped nerves  . PERIPHERAL NEUROPLASTY OF FINGER / HAND / ARM Bilateral    transposition median nerve at carpal tunnel  . TONSILLECTOMY      Current Medications: Current Meds  Medication Sig  . Ascorbic Acid (VITAMIN C) 1000 MG tablet Take 2,000 mg by mouth daily.  Marland Kitchen aspirin EC 81 MG tablet Take 1 tablet (81 mg total) by mouth daily.  . carboxymethylcellulose 1 % ophthalmic solution Apply to eye.  . Cholecalciferol 25 MCG (1000 UT) capsule Take 1,000 Units by mouth  daily.  . dapagliflozin propanediol (FARXIGA) 10 MG TABS tablet 10 mg daily.  . fluticasone (FLONASE) 50 MCG/ACT nasal spray 1 spray by Both Nostrils route daily as needed for Rhinitis.  Marland Kitchen lisinopril-hydrochlorothiazide (PRINZIDE,ZESTORETIC) 20-12.5 MG tablet Take 1 tablet by mouth daily.  . Red Yeast Rice Extract (RED YEAST RICE PO) Take by mouth in the morning and at bedtime.  . [DISCONTINUED] Vitamin D, Ergocalciferol, (DRISDOL) 50000 units CAPS capsule Take 50,000 Units by mouth every 7 (seven) days.     Allergies:   Bee venom, Atorvastatin calcium, Metformin, Pravastatin, Rosuvastatin, Atorvastatin, and Gabapentin   Social History   Socioeconomic History  . Marital status: Married    Spouse name: Not on file  . Number of children: Not on file  . Years of education: Not on file  . Highest education level: Not on file  Occupational History  . Not on file  Tobacco Use  . Smoking status: Former Smoker    Packs/day: 1.00    Types: Cigarettes    Quit date: 11/25/2012    Years since quitting: 6.5  . Smokeless tobacco: Never Used  Substance and Sexual Activity  . Alcohol use: Not Currently  . Drug use: Not Currently  .  Sexual activity: Not on file  Other Topics Concern  . Not on file  Social History Narrative  . Not on file   Social Determinants of Health   Financial Resource Strain:   . Difficulty of Paying Living Expenses:   Food Insecurity:   . Worried About Charity fundraiser in the Last Year:   . Arboriculturist in the Last Year:   Transportation Needs:   . Film/video editor (Medical):   Marland Kitchen Lack of Transportation (Non-Medical):   Physical Activity:   . Days of Exercise per Week:   . Minutes of Exercise per Session:   Stress:   . Feeling of Stress :   Social Connections:   . Frequency of Communication with Friends and Family:   . Frequency of Social Gatherings with Friends and Family:   . Attends Religious Services:   . Active Member of Clubs or  Organizations:   . Attends Archivist Meetings:   Marland Kitchen Marital Status:      Family History: The patient's family history includes Bone cancer in his mother; Congestive Heart Failure in his mother; Heart attack in his father; Leukemia in his brother. ROS:   Please see the history of present illness.    All other systems reviewed and are negative.  EKGs/Labs/Other Studies Reviewed:    The following studies were reviewed today:  EKG:  EKG ordered today and personally reviewed.  The ekg ordered today demonstrates sinus rhythm rightward axis otherwise normal  Recent Labs: 04/08/2019 cholesterol 190 LDL 147 that was on simvastatin that he had to stop because of muscle symptoms triglycerides 105 HDL 30 potassium 4.6 creatinine 1.19 GFR 65 cc  Physical Exam:    VS:  BP 92/60 (BP Location: Left Arm, Patient Position: Sitting, Cuff Size: Normal)   Pulse 67   Ht 6\' 2"  (1.88 m)   Wt (!) 307 lb (139.3 kg)   SpO2 96%   BMI 39.42 kg/m     Wt Readings from Last 3 Encounters:  06/15/19 (!) 307 lb (139.3 kg)  09/09/17 (!) 321 lb (145.6 kg)  11/11/16 (!) 310 lb (140.6 kg)    Blood pressure by me 90/60 sitting and standing right upper extremity GEN:  Well nourished, well developed in no acute distress HEENT: Normal NECK: No JVD; No carotid bruits LYMPHATICS: No lymphadenopathy CARDIAC: RRR, no murmurs, rubs, gallops RESPIRATORY:  Clear to auscultation without rales, wheezing or rhonchi  ABDOMEN: Soft, non-tender, non-distended MUSCULOSKELETAL:  No edema; No deformity  SKIN: Warm and dry NEUROLOGIC:  Alert and oriented x 3 PSYCHIATRIC:  Normal affect    Signed, Shirlee More, MD  06/15/2019 2:18 PM    [facility]

## 2019-06-15 ENCOUNTER — Other Ambulatory Visit: Payer: Self-pay | Admitting: Cardiology

## 2019-06-15 ENCOUNTER — Other Ambulatory Visit: Payer: Self-pay

## 2019-06-15 ENCOUNTER — Ambulatory Visit (INDEPENDENT_AMBULATORY_CARE_PROVIDER_SITE_OTHER): Payer: Medicaid Other | Admitting: Cardiology

## 2019-06-15 ENCOUNTER — Encounter: Payer: Self-pay | Admitting: Cardiology

## 2019-06-15 VITALS — BP 92/60 | HR 67 | Ht 74.0 in | Wt 307.0 lb

## 2019-06-15 DIAGNOSIS — E782 Mixed hyperlipidemia: Secondary | ICD-10-CM

## 2019-06-15 DIAGNOSIS — I119 Hypertensive heart disease without heart failure: Secondary | ICD-10-CM | POA: Diagnosis not present

## 2019-06-15 DIAGNOSIS — I350 Nonrheumatic aortic (valve) stenosis: Secondary | ICD-10-CM

## 2019-06-15 DIAGNOSIS — I25118 Atherosclerotic heart disease of native coronary artery with other forms of angina pectoris: Secondary | ICD-10-CM | POA: Diagnosis not present

## 2019-06-15 DIAGNOSIS — I6521 Occlusion and stenosis of right carotid artery: Secondary | ICD-10-CM | POA: Diagnosis not present

## 2019-06-15 MED ORDER — REPATHA SURECLICK 140 MG/ML ~~LOC~~ SOAJ: 1.0000 "pen " | SUBCUTANEOUS | 6 refills | Status: AC

## 2019-06-15 NOTE — Patient Instructions (Signed)
Medication Instructions:  Your physician has recommended you make the following change in your medication:  DECREASE: Lisinopril/HCTZ to 0.5 mg daily. START: Repatha 140 mg inject into the skin once every two weeks.  *If you need a refill on your cardiac medications before your next appointment, please call your pharmacy*   Lab Work: None If you have labs (blood work) drawn today and your tests are completely normal, you will receive your results only by: Marland Kitchen MyChart Message (if you have MyChart) OR . A paper copy in the mail If you have any lab test that is abnormal or we need to change your treatment, we will call you to review the results.   Testing/Procedures: Your physician has requested that you have an echocardiogram. Echocardiography is a painless test that uses sound waves to create images of your heart. It provides your doctor with information about the size and shape of your heart and how well your heart's chambers and valves are working. This procedure takes approximately one hour. There are no restrictions for this procedure.     Follow-Up: At [facility], you and your health needs are our priority.  As part of our continuing mission to provide you with exceptional heart care, we have created designated Provider Care Teams.  These Care Teams include your primary Cardiologist (physician) and Advanced Practice Providers (APPs -  Physician Assistants and Nurse Practitioners) who all work together to provide you with the care you need, when you need it.  We recommend signing up for the patient portal called "MyChart".  Sign up information is provided on this After Visit Summary.  MyChart is used to connect with patients for Virtual Visits (Telemedicine).  Patients are able to view lab/test results, encounter notes, upcoming appointments, etc.  Non-urgent messages can be sent to your provider as well.   To learn more about what you can do with MyChart, go to [email or web address].     Your next appointment:   3 month(s)  The format for your next appointment:   In Person  Provider:   Shirlee More, MD   Other Instructions We have put in a referral for you to see the lipid clinic. They will call you to schedule this appointment.

## 2019-07-04 IMAGING — CT CT L SPINE W/ CM
2 of 7 series · 5 of 14 positions shown, 6 images · non-contrast
Comparison: 11/28/2016

CLINICAL DATA: Chronic low back pain. Leg pain and weakness. Assess
for change since last year.
TECHNIQUE: Contiguous axial images were obtained through the Lumbar spine after
the intrathecal infusion of infusion. Coronal and sagittal
reconstructions were obtained of the axial image sets.

[Series 3: l spine soft · axial · 0.44mm/px · z∈[-227,-140]mm · 2 of 88 slices shown]
[im 30/88  soft-tissue]
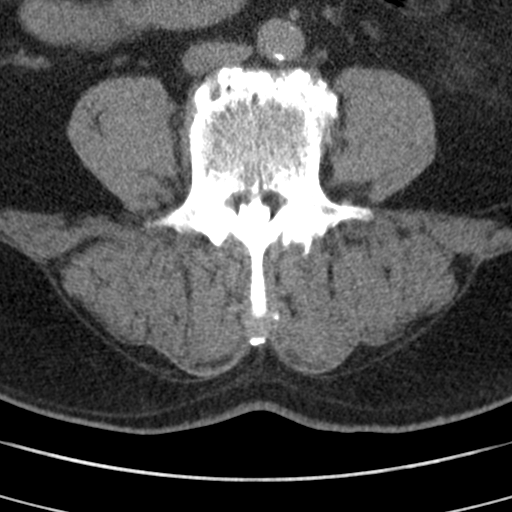
[im 59/88  soft-tissue]
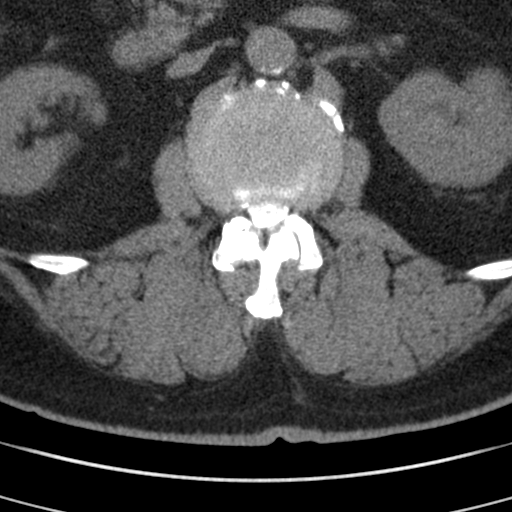

[Series 4: l spine bone · axial · 0.32mm/px · z∈[-254,-128]mm · 3 of 84 slices shown, 4 images]
[im 21/84  soft-tissue]
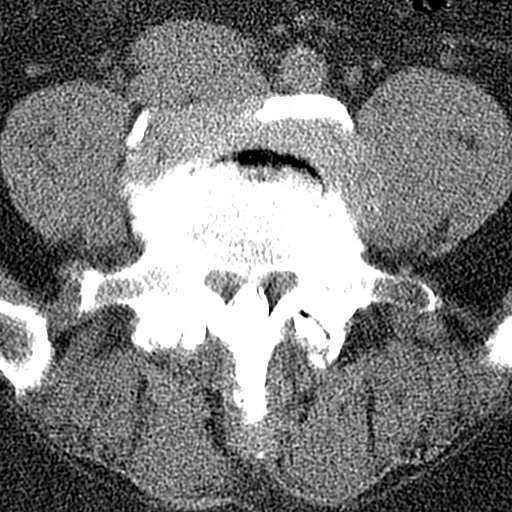
[im 21/84  bone]
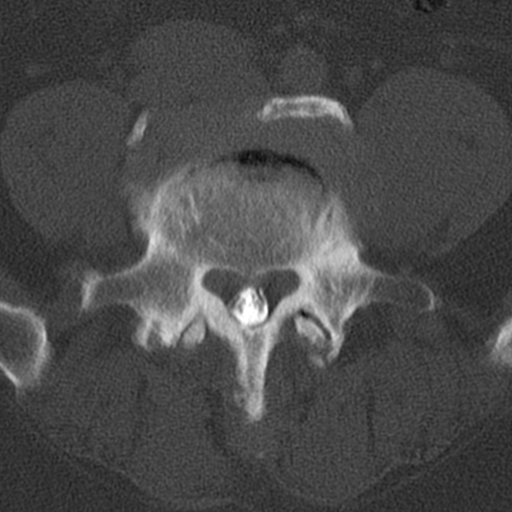
[im 42/84  bone]
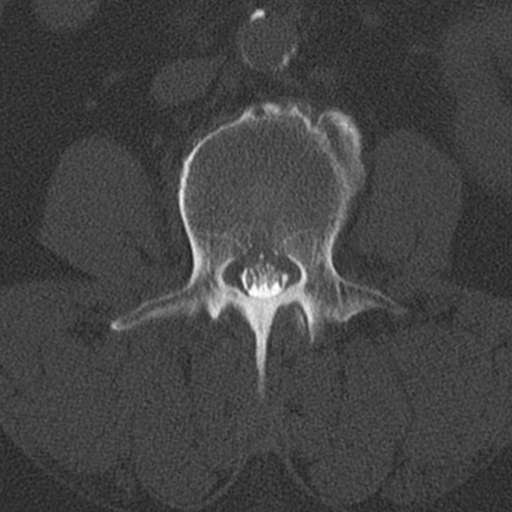
[im 63/84  bone]
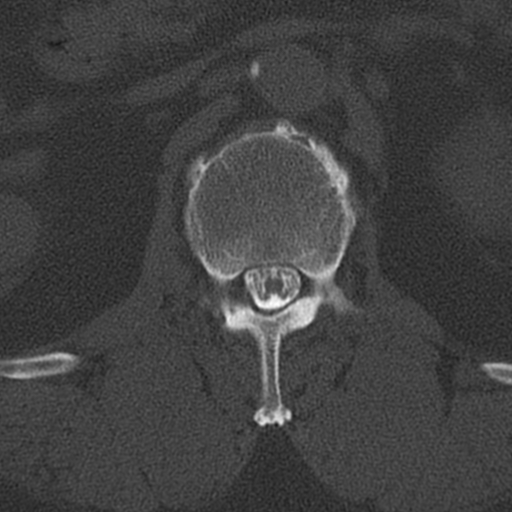

[5 of 14 positions shown; findings below may reference images not displayed]

EXAM:
LUMBAR MYELOGRAM

FLUOROSCOPY TIME:  0 minutes 35 seconds. 268.97 micro gray meter
squared

PROCEDURE:
After thorough discussion of risks and benefits of the procedure
including bleeding, infection, injury to nerves, blood vessels,
adjacent structures as well as headache and CSF leak, written and
oral informed consent was obtained. Consent was obtained by Dr. Tiger
Tsunemori. Time out form was completed.

Patient was positioned prone on the fluoroscopy table. Local
anesthesia was provided with 1% lidocaine without epinephrine after
prepped and draped in the usual sterile fashion. Puncture was
performed at L5-S1 using a 5 inch 22-gauge spinal needle via right
paramedian approach. Using a single pass through the dura, the
needle was placed within the thecal sac, with return of clear CSF.
15 mL of Isovue [id number] was injected into the thecal sac, with normal
opacification of the nerve roots and cauda equina consistent with
free flow within the subarachnoid space.

I personally performed the lumbar puncture and administered the
intrathecal contrast. I also personally performed acquisition of the
myelogram images.
FINDINGS: LUMBAR MYELOGRAM FINDINGS:

There are anterior extradural defects throughout the lumbar region
from L1-2 through L4-5. There is lateral recess narrowing at L2-3,
L3-4 and L4-5. No compression of the thecal sac or S1 root sleeves
seen at L5-S1. Standing flexion extension view show 2 mm of
anterolisthesis at L4-5.

CT LUMBAR MYELOGRAM FINDINGS:

T11-12: Minimal disc bulge. Mild facet hypertrophy. No compressive
stenosis.

T12-L1: Minimal disc bulge. Mild facet hypertrophy. No compressive
stenosis.

L1-2: Moderate disc bulge. Mild facet and ligamentous hypertrophy.
Mild stenosis but without discrete neural compression. Similar
appearance to the previous exam.

L2-3: Moderate disc bulge. Facet and ligamentous hypertrophy.
Stenosis of the lateral recesses and foramina, right more than left.
Similar appearance to the previous study.

L3-4: Moderate disc bulge. Facet and ligamentous hypertrophy.
Stenosis of the lateral recesses and foramina. Similar appearance to
the previous study.

L4-5: Previous right hemilaminectomy. Endplate osteophytes and
bulging of the disc. Facet degeneration and hypertrophy with gaping
joints. Stenosis of the lateral recesses and neural foramina,
similar to the previous study.

L5-S1: Endplate osteophytes and bulging of the disc. Mild facet
hypertrophy. No central canal stenosis. No S1 nerve compression is
seen. Bilateral foraminal stenosis that could compress either or
both L5 nerves. Similar appearance to the previous study.
IMPRESSION: No change detected compared to last year. Degenerative disc disease
throughout the lumbar spine with endplate osteophytes and disc
bulges from L1-2 through L5-S1. No compressive central canal
stenosis at any level. Bilateral lateral recess narrowing that could
cause neural compression from L2-3 through L4-5. Bilateral foraminal
narrowing that could cause neural compression from L2-3 through
L5-S1. Previous partial right hemilaminectomy at L4-5. Foraminal
stenosis particularly pronounced at L4-5 and L5-S1.

## 2019-07-07 ENCOUNTER — Other Ambulatory Visit: Payer: Self-pay

## 2019-07-07 ENCOUNTER — Ambulatory Visit (INDEPENDENT_AMBULATORY_CARE_PROVIDER_SITE_OTHER): Payer: Medicaid Other

## 2019-07-07 DIAGNOSIS — I350 Nonrheumatic aortic (valve) stenosis: Secondary | ICD-10-CM | POA: Diagnosis not present

## 2019-07-07 NOTE — Progress Notes (Unsigned)
Complete echocardiogram hs been performed.  Jimmy Eloni Darius RDCS, RVT

## 2019-07-08 ENCOUNTER — Telehealth: Payer: Self-pay

## 2019-07-08 NOTE — Telephone Encounter (Signed)
Spoke with patient regarding results and recommendation.  Patient verbalizes understanding and is agreeable to plan of care. Advised patient to call back with any issues or concerns.  

## 2019-08-03 DIAGNOSIS — M7070 Other bursitis of hip, unspecified hip: Secondary | ICD-10-CM

## 2019-08-03 HISTORY — DX: Other bursitis of hip, unspecified hip: M70.70

## 2019-09-16 ENCOUNTER — Telehealth: Payer: Self-pay | Admitting: Cardiology

## 2019-09-16 DIAGNOSIS — Z20822 Contact with and (suspected) exposure to covid-19: Secondary | ICD-10-CM | POA: Insufficient documentation

## 2019-09-16 DIAGNOSIS — I959 Hypotension, unspecified: Secondary | ICD-10-CM

## 2019-09-16 DIAGNOSIS — N289 Disorder of kidney and ureter, unspecified: Secondary | ICD-10-CM | POA: Insufficient documentation

## 2019-09-16 HISTORY — DX: Hypotension, unspecified: I95.9

## 2019-09-16 HISTORY — DX: Contact with and (suspected) exposure to covid-19: Z20.822

## 2019-09-16 HISTORY — DX: Disorder of kidney and ureter, unspecified: N28.9

## 2019-09-16 NOTE — Telephone Encounter (Signed)
Pt's wife called and said that the pt was at [facility] and wanted to talk with a nurse about what was said. Please call back

## 2019-09-16 NOTE — Telephone Encounter (Signed)
Pts wife calling to inform Dr. Bettina Gavia that he was admitted and discharged from [facility] for RUQ pain and elevated D-Dimer. No PE was found. Hospital records have not been released, so she was asked to call [facility] to send reports to Dr. Bettina Gavia prior to his OV next week. She was concerned his echo in the hospital showed aortic stenosis. After reviewing his last echo in May 2021, his aortic stenosis and dilation was identified and being followed by Dr. Bettina Gavia. I advised her this would likely be addressed during his visit next week.  She had no further concerns at this time.

## 2019-09-20 NOTE — Progress Notes (Signed)
Cardiology Office Note:    Date:  09/21/2019   ID:  Zachary Andrews, DOB 07-19-1955, MRN [id number]  PCP:  Algis Greenhouse, MD  Cardiologist:  Shirlee More, MD    Referring MD: Algis Greenhouse, MD    ASSESSMENT:    1. Coronary artery disease of native artery of native heart with stable angina pectoris ([place])   2. Hypertensive heart disease without heart failure   3. Nonrheumatic aortic valve stenosis   4. Mixed hyperlipidemia   5. Carotid stenosis, right    PLAN:    In order of problems listed above:  1. He had a recent presentation with what is described as right chest right upper quadrant pain with a pleuritic component was evaluated including a CTA of the chest records are unavailable. 2. BP at target continue current antihypertensives 3. Await records including recent echocardiogram he be seen back in the office in the next 2 to 3 days to pull things together 4. He is taking over-the-counter statin will reassess at his office visit 5. He has not scheduled follow-up with vascular surgery or from a carotid duplex in view of his previous CVA and chronic right internal carotid artery stenosis   Next appointment: Next 2 to 3 days after records are received   Medication Adjustments/Labs and Tests Ordered: Current medicines are reviewed at length with the patient today.  Concerns regarding medicines are outlined above.  No orders of the defined types were placed in this encounter.  No orders of the defined types were placed in this encounter.   Chief Complaint  Patient presents with  . Follow-up    He is admitted to [facility] last week with chest pain discharge Thursday comes to see me in follow-up    History of Present Illness:    Zachary Andrews is a 64 y.o. male with a hx of coronary artery disease hypertensive heart disease right carotid stenosis with previous endarterectomy and with chronic total occlusion moderate aortic stenosis and hyperlipidemia last seen  06/15/2019.  [facility] records show that in 2015 he had troponin normal was felt to be unstable angina and was referred to [facility] hospital for coronary angiography no intervention was performed and we are unable to access that report.    Compliance with diet, lifestyle and medications: Yes  He had a recent discharge from [facility] last Thursday was seen by cardiologist sounds like he either had a cardiac CTA or a myocardial perfusion study had an echocardiogram told he had a heart murmur and was set up to follow with the group from [facility] but came back to my office.  Patient and his wife were left with the impression that I had his medical records.  Echocardiogram 2015 [facility] health had aortic valve thickening and had a minimal gradient although today be described as aortic valve sclerosis peak velocity 2.2 m/s squared.  The discharge handout the patient has from the hospital mentions aortic stenosis we are going to be unable to access his records today and he will be scheduled to come back to the office in 2 to 3 days to follow-up after I can review his hospital records.  During this visit I phoned his primary care physician and neither of [place] can access the records from [facility] in real-time. Past Medical History:  Diagnosis Date  . Atypical nevi 12/21/2016  . Carpal tunnel syndrome, bilateral 06/01/2015  . Central spinal stenosis 12/21/2016  . Chronic bilateral low back  pain with left-sided sciatica 07/11/2015  . Coronary artery disease involving native coronary artery of native heart 12/21/2016  . Elevated LFTs 08/22/2017   ICD-10 cut over  . Frequent falls 10/15/2016  . H/O spinal stenosis 08/22/2017  . Hypertension 02/06/2015  . 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 Lumbar disc disease 08/22/2017  . Lumbar radiculopathy 12/25/2015  . Lumbar spondylosis 04/29/2016  . Lumbar stenosis with neurogenic  claudication 12/21/2016  . Microalbuminuria 08/22/2017  . Mixed hyperlipidemia 03/13/2015   Refusing statin  . Nephrolithiasis 12/21/2016  . Neuropathy 08/19/2017   From cervical and lumbar spinal stenosis  And carpal tunnel  . Non-recurrent unilateral inguinal hernia without obstruction or gangrene 12/23/2016   2018  . Numbness 02/06/2015  . Obesity 08/22/2017  . Obstructive sleep apnea on CPAP 04/10/2016   Managed PULM  . Occlusion of right internal carotid artery 12/21/2016   >70%  . 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  . Osteoarthritis of shoulder 12/21/2016  . Pain in left knee 07/08/2017  . Polyp of colon 12/21/2016  . Primary osteoarthritis of left hip 11/19/2016  . Sexual dysfunction 08/22/2017  . Spinal cord compression ([place]) 02/06/2015  . Type 2 diabetes mellitus without complication, without long-term current use of insulin ([place]) 04/10/2016  . Vitamin D deficiency 04/10/2016    Past Surgical History:  Procedure Laterality Date  . CARPAL TUNNEL RELEASE Bilateral   . CERVICAL SPINE SURGERY    . HEMORRHOID SURGERY    . LUMBAR SPINE SURGERY     scraped nerves  . PERIPHERAL NEUROPLASTY OF FINGER / HAND / ARM Bilateral    transposition median nerve at carpal tunnel  . TONSILLECTOMY      Current Medications: Current Meds  Medication Sig  . Ascorbic Acid (VITAMIN C) 1000 MG tablet Take 2,000 mg by mouth daily.  Marland Kitchen aspirin EC 81 MG tablet Take 1 tablet (81 mg total) by mouth daily.  . carboxymethylcellulose 1 % ophthalmic solution Apply to eye.  . Cholecalciferol 25 MCG (1000 UT) capsule Take 1,000 Units by mouth daily.  . dapagliflozin propanediol (FARXIGA) 10 MG TABS tablet 10 mg daily.  . Evolocumab (REPATHA SURECLICK) 737 MG/ML SOAJ Inject 1 pen into the skin every 14 (fourteen) days.  . fluticasone (FLONASE) 50 MCG/ACT nasal spray 1 spray by Both Nostrils route daily as needed for Rhinitis.  Marland Kitchen lisinopril-hydrochlorothiazide (PRINZIDE,ZESTORETIC)  20-12.5 MG tablet Take 0.5 tablets by mouth daily.  . Red Yeast Rice Extract (RED YEAST RICE PO) Take by mouth in the morning and at bedtime.     Allergies:   Bee venom, Atorvastatin calcium, Metformin, Pravastatin, Rosuvastatin, Atorvastatin, and Gabapentin   Social History   Socioeconomic History  . Marital status: Married    Spouse name: Not on file  . Number of children: Not on file  . Years of education: Not on file  . Highest education level: Not on file  Occupational History  . Not on file  Tobacco Use  . Smoking status: Former Smoker    Packs/day: 1.00    Types: Cigarettes    Quit date: 11/25/2012    Years since quitting: 6.8  . Smokeless tobacco: Never Used  Vaping Use  . Vaping Use: Never used  Substance and Sexual Activity  . Alcohol use: Not Currently  . Drug use: Not Currently  . Sexual activity: Not on file  Other Topics Concern  . Not on file  Social History Narrative  . Not on file  Social Determinants of Health   Financial Resource Strain:   . Difficulty of Paying Living Expenses:   Food Insecurity:   . Worried About Charity fundraiser in the Last Year:   . Arboriculturist in the Last Year:   Transportation Needs:   . Film/video editor (Medical):   Marland Kitchen Lack of Transportation (Non-Medical):   Physical Activity:   . Days of Exercise per Week:   . Minutes of Exercise per Session:   Stress:   . Feeling of Stress :   Social Connections:   . Frequency of Communication with Friends and Family:   . Frequency of Social Gatherings with Friends and Family:   . Attends Religious Services:   . Active Member of Clubs or Organizations:   . Attends Archivist Meetings:   Marland Kitchen Marital Status:      Family History: The patient's family history includes Bone cancer in his mother; Congestive Heart Failure in his mother; Heart attack in his father; Leukemia in his brother. ROS:   Please see the history of present illness.    All other systems  reviewed and are negative.  EKGs/Labs/Other Studies Reviewed:    The following studies were reviewed today:    Physical Exam:    VS:  BP 112/66 (BP Location: Right Arm, Patient Position: Sitting, Cuff Size: Normal)   Pulse 66   Ht 6\' 2"  (1.88 m)   Wt (!) 305 lb 12.8 oz (138.7 kg)   SpO2 98%   BMI 39.26 kg/m     Wt Readings from Last 3 Encounters:  09/21/19 (!) 305 lb 12.8 oz (138.7 kg)  06/15/19 (!) 307 lb (139.3 kg)  09/09/17 (!) 321 lb (145.6 kg)     GEN: Obese  Well nourished, well developed in no acute distress HEENT: Normal NECK: No JVD; No carotid bruits LYMPHATICS: No lymphadenopathy CARDIAC: Is a grade 1/6 to 2/6 systolic ejection murmur aortic area but they do have the left to start outssmpti text RRR, no murmurs, rubs, gallops RESPIRATORY:  Clear to auscultation without rales, wheezing or rhonchi  ABDOMEN: Soft, non-tender, non-distended MUSCULOSKELETAL:  No edema; No deformity  SKIN: Warm and dry NEUROLOGIC:  Alert and oriented x 3 PSYCHIATRIC:  Normal affect    Signed, Shirlee More, MD  09/21/2019 8:20 AM    [facility]

## 2019-09-21 ENCOUNTER — Ambulatory Visit (INDEPENDENT_AMBULATORY_CARE_PROVIDER_SITE_OTHER): Payer: Medicaid Other | Admitting: Cardiology

## 2019-09-21 ENCOUNTER — Encounter: Payer: Self-pay | Admitting: Cardiology

## 2019-09-21 ENCOUNTER — Other Ambulatory Visit: Payer: Self-pay

## 2019-09-21 VITALS — BP 112/66 | HR 66 | Ht 74.0 in | Wt 305.8 lb

## 2019-09-21 DIAGNOSIS — E119 Type 2 diabetes mellitus without complications: Secondary | ICD-10-CM

## 2019-09-21 DIAGNOSIS — E782 Mixed hyperlipidemia: Secondary | ICD-10-CM

## 2019-09-21 DIAGNOSIS — I6521 Occlusion and stenosis of right carotid artery: Secondary | ICD-10-CM

## 2019-09-21 DIAGNOSIS — I119 Hypertensive heart disease without heart failure: Secondary | ICD-10-CM | POA: Diagnosis not present

## 2019-09-21 DIAGNOSIS — I350 Nonrheumatic aortic (valve) stenosis: Secondary | ICD-10-CM

## 2019-09-21 DIAGNOSIS — Z86711 Personal history of pulmonary embolism: Secondary | ICD-10-CM | POA: Insufficient documentation

## 2019-09-21 DIAGNOSIS — I25118 Atherosclerotic heart disease of native coronary artery with other forms of angina pectoris: Secondary | ICD-10-CM

## 2019-09-21 DIAGNOSIS — I2699 Other pulmonary embolism without acute cor pulmonale: Secondary | ICD-10-CM

## 2019-09-21 DIAGNOSIS — G9520 Unspecified cord compression: Secondary | ICD-10-CM

## 2019-09-21 HISTORY — DX: Other pulmonary embolism without acute cor pulmonale: I26.99

## 2019-09-21 NOTE — Patient Instructions (Signed)
Medication Instructions:  Your physician recommends that you continue on your current medications as directed. Please refer to the Current Medication list given to you today.  *If you need a refill on your cardiac medications before your next appointment, please call your pharmacy*   Lab Work: None If you have labs (blood work) drawn today and your tests are completely normal, you will receive your results only by:  [place] (if you have MyChart) OR  A paper copy in the mail If you have any lab test that is abnormal or we need to change your treatment, we will call you to review the results.   Testing/Procedures: None   Follow-Up: At [facility], you and your health needs are our priority.  As part of our continuing mission to provide you with exceptional heart care, we have created designated Provider Care Teams.  These Care Teams include your primary Cardiologist (physician) and Advanced Practice Providers (APPs -  Physician Assistants and Nurse Practitioners) who all work together to provide you with the care you need, when you need it.  We recommend signing up for the patient portal called "MyChart".  Sign up information is provided on this After Visit Summary.  MyChart is used to connect with patients for Virtual Visits (Telemedicine).  Patients are able to view lab/test results, encounter notes, upcoming appointments, etc.  Non-urgent messages can be sent to your provider as well.   To learn more about what you can do with MyChart, go to [email or web address].    Your next appointment:   09/23/19 AT 8:20 AM  The format for your next appointment:   In Person  Provider:   Shirlee More, MD   Other Instructions

## 2019-09-23 ENCOUNTER — Encounter: Payer: Self-pay | Admitting: Cardiology

## 2019-09-23 ENCOUNTER — Ambulatory Visit (INDEPENDENT_AMBULATORY_CARE_PROVIDER_SITE_OTHER): Payer: Medicaid Other | Admitting: Cardiology

## 2019-09-23 ENCOUNTER — Other Ambulatory Visit: Payer: Self-pay

## 2019-09-23 VITALS — HR 62 | Ht 74.0 in | Wt 312.6 lb

## 2019-09-23 DIAGNOSIS — I25118 Atherosclerotic heart disease of native coronary artery with other forms of angina pectoris: Secondary | ICD-10-CM | POA: Diagnosis not present

## 2019-09-23 DIAGNOSIS — E782 Mixed hyperlipidemia: Secondary | ICD-10-CM | POA: Diagnosis not present

## 2019-09-23 DIAGNOSIS — I350 Nonrheumatic aortic (valve) stenosis: Secondary | ICD-10-CM | POA: Diagnosis not present

## 2019-09-23 DIAGNOSIS — I6521 Occlusion and stenosis of right carotid artery: Secondary | ICD-10-CM

## 2019-09-23 DIAGNOSIS — I119 Hypertensive heart disease without heart failure: Secondary | ICD-10-CM

## 2019-09-23 LAB — BASIC METABOLIC PANEL
BUN/Creatinine Ratio: 21 (ref 10–24)
BUN: 21 mg/dL (ref 8–27)
CO2: 27 mmol/L (ref 20–29)
Calcium: 9.4 mg/dL (ref 8.6–10.2)
Chloride: 104 mmol/L (ref 96–106)
Creatinine, Ser: 1.02 mg/dL (ref 0.76–1.27)
GFR calc Af Amer: 90 mL/min/{1.73_m2} (ref >59)
GFR calc non Af Amer: 78 mL/min/{1.73_m2} (ref >59)
Glucose: 148 mg/dL — ABNORMAL HIGH (ref 65–99)
Potassium: 4.2 mmol/L (ref 3.5–5.2)
Sodium: 139 mmol/L (ref 134–144)

## 2019-09-23 MED ORDER — AMOXICILLIN 500 MG PO CAPS: 2000.0000 mg | ORAL_CAPSULE | Freq: Every day | ORAL | 0 refills | Status: DC | PRN

## 2019-09-23 NOTE — Patient Instructions (Addendum)
Medication Instructions:  Your physician recommends that you continue on your current medications as directed. Please refer to the Current Medication list given to you today.  *If you need a refill on your cardiac medications before your next appointment, please call your pharmacy*   Lab Work: Your physician recommends that you return for lab work in: TODAY BMP If you have labs (blood work) drawn today and your tests are completely normal, you will receive your results only by: Marland Kitchen MyChart Message (if you have MyChart) OR . A paper copy in the mail If you have any lab test that is abnormal or we need to change your treatment, we will call you to review the results.   Testing/Procedures: Your physician has requested that you have an echocardiogram in 6 months. Echocardiography is a painless test that uses sound waves to create images of your heart. It provides your doctor with information about the size and shape of your heart and how well your heart's chambers and valves are working. This procedure takes approximately one hour. There are no restrictions for this procedure.     [facility] [address] [place],  [address] Phone:  [phone]    Please arrive 15 minutes prior to your appointment time for registration and insurance purposes.  The test will take approximately 3 to 4 hours to complete; you may bring reading material.  If someone comes with you to your appointment, they will need to remain in the main lobby due to limited space in the testing area. **If you are pregnant or breastfeeding, please notify the nuclear lab prior to your appointment**  How to prepare for your Myocardial Perfusion Test: . Do not eat or drink 3 hours prior to your test, except you may have water. . Do not consume products containing caffeine (regular or decaffeinated) 12 hours prior to your test. (ex: coffee, chocolate, sodas, tea). . Do bring a list of your  current medications with you.  If not listed below, you may take your medications as normal. . HOLD diabetic medication/insulin the morning of the test: [place] . Do wear comfortable clothes (no dresses or overalls) and walking shoes, tennis shoes preferred (No heels or open toe shoes are allowed). . Do NOT wear cologne, perfume, aftershave, or lotions (deodorant is allowed). . If these instructions are not followed, your test will have to be rescheduled.  Please report to [address] for your test.  If you have questions or concerns about your appointment, you can call the [facility] at [phone].  If you cannot keep your appointment, please provide 24 hours notification to the Nuclear Lab, to avoid a possible $50 charge to your account.   Follow-Up: At [facility], you and your health needs are our priority.  As part of our continuing mission to provide you with exceptional heart care, we have created designated Provider Care Teams.  These Care Teams include your primary Cardiologist (physician) and Advanced Practice Providers (APPs -  Physician Assistants and Nurse Practitioners) who all work together to provide you with the care you need, when you need it.  We recommend signing up for the patient portal called "MyChart".  Sign up information is provided on this After Visit Summary.  MyChart is used to connect with patients for Virtual Visits (Telemedicine).  Patients are able to view lab/test results, encounter notes, upcoming appointments, etc.  Non-urgent messages can be sent to your provider as well.  To learn more about what you can do with MyChart, go to [email or web address].     Your next appointment:   3 month(s)  The format for your next appointment:   In Person  Provider:   Shirlee More, MD   Other Instructions

## 2019-09-23 NOTE — Progress Notes (Signed)
Cardiology Office Note:    Date:  09/23/2019   ID:  Zachary Andrews, DOB 1956/01/28, MRN [id number]  PCP:  Zachary Greenhouse, MD  Cardiologist:  Zachary More, MD    Referring MD: Zachary Greenhouse, MD    ASSESSMENT:    1. Nonrheumatic aortic valve stenosis   2. Hypertensive heart disease without heart failure   3. Coronary artery disease of native artery of native heart with stable angina pectoris ([place])   4. Mixed hyperlipidemia   5. Occlusion of right internal carotid artery    PLAN:    In order of problems listed above:  1. His aortic stenosis from our echocardiogram in May was mild to moderate we will recheck in 6 months and see him in the office and start endocarditis prophylaxis amoxicillin 2 g 1 hour prior to dental procedures 2. Stable continue current antihypertensives 3. Check myocardial perfusion study 4. Continue over-the-counter statin plus Repatha 5. Stable on most recent duplex   Next appointment: 3 months   Medication Adjustments/Labs and Tests Ordered: Current medicines are reviewed at length with the patient today.  Concerns regarding medicines are outlined above.  Orders Placed This Encounter  Procedures  . Basic Metabolic Panel (BMET)   No orders of the defined types were placed in this encounter.   No chief complaint on file.   History of Present Illness:    Zachary Andrews is a 64 y.o. male with a hx of coronary artery disease hypertensive heart disease right carotid stenosis with previous endarterectomy and with chronic total occlusion moderate aortic stenosis and hyperlipidemia.[facility] show that in 2015 he had troponin normal was felt to be unstable angina and was referred to [facility] hospital for coronary angiography no intervention was performed and we are unable to access that report.  Echocardiogram in our office 07/16/2019 was remarkably similar with a mean gradient of 20 and a valve area 1.4 cm.  He also had enlargement  ascending aorta 40 mm.  He was seen 09/21/2019 particular at the direction of the physicians at [facility] for follow-up from recent hospitalization.  Unfortunately are not on epic and no records were able to be obtained.  Subsequently I received his records from the hospitalization 09/14/2019 to 09/15/2019 he is admitted with shortness of breath and right-sided pleuritic chest pain and with a clinical concern that he had experienced pulmonary embolism.  His D-dimer is significantly elevated twice upper limits of normal by their assay chest x-ray unremarkable CTA was performed that unfortunately was nondiagnostic due to missed timing of bolus lung and vascular structures were normal duplex of the lower extremities showed no evidence of deep vein thrombosis and decision was made not to pursue the diagnosis of venous thromboembolism further.  Other testing included serial troponins high-sensitivity that were low proBNP level was low CBC renal function were normal.  He had an echocardiogram performed unable to review it as of today it showed the presence of moderate concentric LVH normal left ventricular global and regional systolic function EF 55 to 60% the aortic valve was mildly thickened and calcified was felt to be trileaflet and moderate stenosis seen with peak and mean gradients were 36 and 20 mmHg.  There is no valvular aortic regurgitation.  He is here with his wife today we reviewed his records.  He presented with pleuritic chest pain shortness of breath.  Unfortunately his CTA was nondiagnostic and his D-dimer was significantly elevated.  His symptoms have resolved he  may well have had simple idiopathic pleuritis however he needs to undergo further evaluation we will recheck renal function and repeat a CTA.  He is comfortable with this approach.  His gallbladder ultrasound was normal no GI symptoms no chest pain palpitation or syncope.  He has known aortic stenosis is moderate we will recheck an  echocardiogram in 6 months.  With his history of CAD he will be set up for a perfusion scan in my office although his presentation had no evidence of acute coronary syndrome. Past Medical History:  Diagnosis Date  . Atypical nevi 12/21/2016  . Carpal tunnel syndrome, bilateral 06/01/2015  . Central spinal stenosis 12/21/2016  . Chronic bilateral low back pain with left-sided sciatica 07/11/2015  . Coronary artery disease involving native coronary artery of native heart 12/21/2016  . Elevated LFTs 08/22/2017   ICD-10 cut over  . Frequent falls 10/15/2016  . H/O spinal stenosis 08/22/2017  . Hypertension 02/06/2015  . 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 Lumbar disc disease 08/22/2017  . Lumbar radiculopathy 12/25/2015  . Lumbar spondylosis 04/29/2016  . Lumbar stenosis with neurogenic claudication 12/21/2016  . Microalbuminuria 08/22/2017  . Mixed hyperlipidemia 03/13/2015   Refusing statin  . Nephrolithiasis 12/21/2016  . Neuropathy 08/19/2017   From cervical and lumbar spinal stenosis  And carpal tunnel  . Non-recurrent unilateral inguinal hernia without obstruction or gangrene 12/23/2016   2018  . Numbness 02/06/2015  . Obesity 08/22/2017  . Obstructive sleep apnea on CPAP 04/10/2016   Managed PULM  . Occlusion of right internal carotid artery 12/21/2016   >70%  . 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  . Osteoarthritis of shoulder 12/21/2016  . Pain in left knee 07/08/2017  . Polyp of colon 12/21/2016  . Primary osteoarthritis of left hip 11/19/2016  . Sexual dysfunction 08/22/2017  . Spinal cord compression ([place]) 02/06/2015  . Type 2 diabetes mellitus without complication, without long-term current use of insulin ([place]) 04/10/2016  . Vitamin D deficiency 04/10/2016    Past Surgical History:  Procedure Laterality Date  . CARPAL TUNNEL RELEASE Bilateral   . CERVICAL SPINE SURGERY    . HEMORRHOID SURGERY    . LUMBAR  SPINE SURGERY     scraped nerves  . PERIPHERAL NEUROPLASTY OF FINGER / HAND / ARM Bilateral    transposition median nerve at carpal tunnel  . TONSILLECTOMY      Current Medications: Current Meds  Medication Sig  . Ascorbic Acid (VITAMIN C) 1000 MG tablet Take 2,000 mg by mouth daily.  Marland Kitchen aspirin EC 81 MG tablet Take 1 tablet (81 mg total) by mouth daily.  . carboxymethylcellulose 1 % ophthalmic solution Apply to eye.  . Cholecalciferol 25 MCG (1000 UT) capsule Take 1,000 Units by mouth daily.  . dapagliflozin propanediol (FARXIGA) 10 MG TABS tablet 10 mg daily.  . Evolocumab (REPATHA SURECLICK) 884 MG/ML SOAJ Inject 1 pen into the skin every 14 (fourteen) days.  . fluticasone (FLONASE) 50 MCG/ACT nasal spray 1 spray by Both Nostrils route daily as needed for Rhinitis.  Marland Kitchen lisinopril-hydrochlorothiazide (PRINZIDE,ZESTORETIC) 20-12.5 MG tablet Take 0.5 tablets by mouth daily.  . Red Yeast Rice Extract (RED YEAST RICE PO) Take by mouth in the morning and at bedtime.     Allergies:   Bee venom, Atorvastatin calcium, Metformin, Pravastatin, Rosuvastatin, Atorvastatin, and Gabapentin   Social History   Socioeconomic History  . Marital status: Married    Spouse name: Not on file  .  Number of children: Not on file  . Years of education: Not on file  . Highest education level: Not on file  Occupational History  . Not on file  Tobacco Use  . Smoking status: Former Smoker    Packs/day: 1.00    Types: Cigarettes    Quit date: 11/25/2012    Years since quitting: 6.8  . Smokeless tobacco: Never Used  Vaping Use  . Vaping Use: Never used  Substance and Sexual Activity  . Alcohol use: Not Currently  . Drug use: Not Currently  . Sexual activity: Not on file  Other Topics Concern  . Not on file  Social History Narrative  . Not on file   Social Determinants of Health   Financial Resource Strain:   . Difficulty of Paying Living Expenses:   Food Insecurity:   . Worried About Paediatric nurse in the Last Year:   . Arboriculturist in the Last Year:   Transportation Needs:   . Film/video editor (Medical):   Marland Kitchen Lack of Transportation (Non-Medical):   Physical Activity:   . Days of Exercise per Week:   . Minutes of Exercise per Session:   Stress:   . Feeling of Stress :   Social Connections:   . Frequency of Communication with Friends and Family:   . Frequency of Social Gatherings with Friends and Family:   . Attends Religious Services:   . Active Member of Clubs or Organizations:   . Attends Archivist Meetings:   Marland Kitchen Marital Status:      Family History: The patient's family history includes Bone cancer in his mother; Congestive Heart Failure in his mother; Heart attack in his father; Leukemia in his brother. ROS:   Please see the history of present illness.    All other systems reviewed and are negative.  EKGs/Labs/Other Studies Reviewed:    The following studies were reviewed today:    Physical Exam:    VS:  Pulse 62   Ht 6\' 2"  (1.88 m)   Wt (!) 312 lb 9.6 oz (141.8 kg)   SpO2 95%   BMI 40.14 kg/m     Wt Readings from Last 3 Encounters:  09/23/19 (!) 312 lb 9.6 oz (141.8 kg)  09/21/19 (!) 305 lb 12.8 oz (138.7 kg)  06/15/19 (!) 307 lb (139.3 kg)     GEN:  Well nourished, well developed in no acute distress HEENT: Normal NECK: No JVD; No carotid bruits LYMPHATICS: No lymphadenopathy CARDIAC: Very unimpressive exam grade 1/6 to 2/6 midsystolic ejection murmur influenced by his large body mass and thick chest wall.  S2 is normal RRR, no murmurs, rubs, gallops RESPIRATORY:  Clear to auscultation without rales, wheezing or rhonchi  ABDOMEN: Soft, non-tender, non-distended MUSCULOSKELETAL:  No edema; No deformity  SKIN: Warm and dry NEUROLOGIC:  Alert and oriented x 3 PSYCHIATRIC:  Normal affect    Signed, Zachary More, MD  09/23/2019 9:22 AM    [facility]

## 2019-09-28 ENCOUNTER — Encounter: Payer: Self-pay | Admitting: Cardiology

## 2019-09-30 ENCOUNTER — Telehealth: Payer: Self-pay

## 2019-09-30 NOTE — Telephone Encounter (Signed)
-----   Message from Richardo Priest, MD sent at 09/30/2019  7:51 AM EDT ----- CTA of the chest is good aorta is normal there was no pulmonary embolism.

## 2019-09-30 NOTE — Telephone Encounter (Signed)
Spoke with patient regarding results and recommendation.  Patient verbalizes understanding and is agreeable to plan of care. Advised patient to call back with any issues or concerns.  

## 2019-10-06 ENCOUNTER — Telehealth (HOSPITAL_COMMUNITY): Payer: Self-pay | Admitting: *Deleted

## 2019-10-06 NOTE — Telephone Encounter (Signed)
Patient given detailed instructions per Myocardial Perfusion Study Information Sheet for the test on 10/12/19 at 11:30. Patient notified to arrive 15 minutes early and that it is imperative to arrive on time for appointment to keep from having the test rescheduled.  If you need to cancel or reschedule your appointment, please call the office within 24 hours of your appointment. . Patient verbalized understanding.Zachary Andrews

## 2019-10-12 ENCOUNTER — Ambulatory Visit (INDEPENDENT_AMBULATORY_CARE_PROVIDER_SITE_OTHER): Payer: Medicaid Other

## 2019-10-12 ENCOUNTER — Other Ambulatory Visit: Payer: Medicaid Other

## 2019-10-12 ENCOUNTER — Other Ambulatory Visit: Payer: Self-pay

## 2019-10-12 DIAGNOSIS — I119 Hypertensive heart disease without heart failure: Secondary | ICD-10-CM | POA: Diagnosis not present

## 2019-10-12 DIAGNOSIS — I6521 Occlusion and stenosis of right carotid artery: Secondary | ICD-10-CM | POA: Diagnosis not present

## 2019-10-12 DIAGNOSIS — I25118 Atherosclerotic heart disease of native coronary artery with other forms of angina pectoris: Secondary | ICD-10-CM

## 2019-10-12 DIAGNOSIS — E782 Mixed hyperlipidemia: Secondary | ICD-10-CM

## 2019-10-12 DIAGNOSIS — I350 Nonrheumatic aortic (valve) stenosis: Secondary | ICD-10-CM

## 2019-10-12 MED ORDER — REGADENOSON 0.4 MG/5ML IV SOLN
0.4000 mg | Freq: Once | INTRAVENOUS | Status: AC
  Administered 2019-10-12: 0.4 mg via INTRAVENOUS

## 2019-10-12 MED ORDER — TECHNETIUM TC 99M TETROFOSMIN IV KIT
31.4000 | PACK | Freq: Once | INTRAVENOUS | Status: AC | PRN
  Administered 2019-10-12: 31.4 via INTRAVENOUS

## 2019-10-13 ENCOUNTER — Ambulatory Visit: Payer: Medicaid Other

## 2019-10-13 MED ORDER — TECHNETIUM TC 99M TETROFOSMIN IV KIT
32.7000 | PACK | Freq: Once | INTRAVENOUS | Status: AC | PRN
Start: 2019-10-13 — End: 2019-10-13
  Administered 2019-10-13: 32.7 via INTRAVENOUS

## 2019-10-14 ENCOUNTER — Telehealth: Payer: Self-pay

## 2019-10-14 LAB — MYOCARDIAL PERFUSION IMAGING
LV dias vol: 124 mL (ref 62–150)
LV sys vol: 45 mL
Peak HR: 76 {beats}/min
Rest HR: 68 {beats}/min
SDS: 3
SRS: 0
SSS: 3
TID: 0.84

## 2019-10-14 NOTE — Telephone Encounter (Signed)
-----   Message from Richardo Priest, MD sent at 10/14/2019 12:41 PM EDT ----- Good result the images are normal no severe CAD

## 2019-10-14 NOTE — Telephone Encounter (Signed)
Spoke with patient regarding results and recommendation.  Patient verbalizes understanding and is agreeable to plan of care. Advised patient to call back with any issues or concerns.  

## 2019-12-28 ENCOUNTER — Ambulatory Visit: Payer: Medicaid Other | Admitting: Cardiology

## 2020-01-06 ENCOUNTER — Other Ambulatory Visit: Payer: Self-pay

## 2020-01-06 NOTE — Progress Notes (Signed)
Cardiology Office Note:    Date:  01/07/2020   ID:  Zachary Andrews, DOB 1955/07/15, MRN [id number]  PCP:  Algis Greenhouse, MD  Cardiologist:  Shirlee More, MD    Referring MD: Algis Greenhouse, MD    ASSESSMENT:    1. Nonrheumatic aortic valve stenosis   2. Hypertensive heart disease without heart failure   3. Coronary artery disease of native artery of native heart with stable angina pectoris ([place])   4. Mixed hyperlipidemia   5. Carotid stenosis, right   6. Type 2 diabetes mellitus without complication, without long-term current use of insulin ([facility])    PLAN:    In order of problems listed above:  1. Stable his aortic stenosis remains mild to moderate will need a follow-up echocardiogram 2 years 2. Improved BP at target continue current therapy including ACE inhibitor thiazide diuretic 3. Stable CAD having no angina New York Heart Association class I continue current medical therapy as myocardial perfusion study shows no evidence of ischemia 4. Follow-up vascular surgery 5. For hyperlipidemia continue Repatha and over-the-counter red rice yeast recheck lipid profile today on therapy 6. Stable type 2 diabetes managed by his PCP on appropriate cardiovascular protective medications with Metformin and SGLT2 inhibitor   Next appointment: 6 months   Medication Adjustments/Labs and Tests Ordered: Current medicines are reviewed at length with the patient today.  Concerns regarding medicines are outlined above.  Orders Placed This Encounter  Procedures  . Comprehensive metabolic panel  . Lipid panel   No orders of the defined types were placed in this encounter.   Chief Complaint  Patient presents with  . Follow-up    After cardiac testing    History of Present Illness:    Zachary Andrews is a 64 y.o. male with a hx of moderate aortic stenosis CAD hypertensive heart disease without heart failure mixed hyperlipidemia and occlusion of the right coronary artery.  Last seen  09/23/2019 Compliance with diet, lifestyle and medications: Yes  He tolerates PCSK9 inhibitor without difficulty. He is seeing vascular surgery in the next few days and follow-up No angina edema orthopnea palpitation or syncope. I reviewed the results of his echocardiogram and perfusion study with the patient and his wife who was present participate in the evaluation decision making  He underwent a myocardial perfusion test 09/23/2019 showing normal left ventricular function EF 63 and normal perfusion it was low risk with no evidence of ischemia.  He did have hypotension during the test and into recovery. Echocardiogram performed 07/10/2019 at peak and mean gradients of 37/21 mmHg and a VTI ratio of 0.41 valve area 1.4 cm Past Medical History:  Diagnosis Date  . Atypical nevi 12/21/2016  . Carpal tunnel syndrome, bilateral 06/01/2015  . Central spinal stenosis 12/21/2016  . Chronic bilateral low back pain with left-sided sciatica 07/11/2015  . Coronary artery disease involving native coronary artery of native heart 12/21/2016  . Elevated LFTs 08/22/2017   ICD-10 cut over  . Frequent falls 10/15/2016  . H/O spinal stenosis 08/22/2017  . Hypertension 02/06/2015  . 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 Lumbar disc disease 08/22/2017  . Lumbar radiculopathy 12/25/2015  . Lumbar spondylosis 04/29/2016  . Lumbar stenosis with neurogenic claudication 12/21/2016  . Microalbuminuria 08/22/2017  . Mixed hyperlipidemia 03/13/2015   Refusing statin  . Nephrolithiasis 12/21/2016  . Neuropathy 08/19/2017   From cervical and lumbar spinal stenosis  And carpal tunnel  . Non-recurrent unilateral inguinal hernia without obstruction  or gangrene 12/23/2016   2018  . Numbness 02/06/2015  . Obesity 08/22/2017  . Obstructive sleep apnea on CPAP 04/10/2016   Managed PULM  . Occlusion of right internal carotid artery 12/21/2016   >70%  . Osteoarthritis of cervical spine  with myelopathy 02/06/2015  . Osteoarthritis of shoulder 12/21/2016  . Pain in left knee 07/08/2017  . Polyp of colon 12/21/2016  . Primary osteoarthritis of left hip 11/19/2016  . Sexual dysfunction 08/22/2017  . Spinal cord compression ([place]) 02/06/2015  . Type 2 diabetes mellitus without complication, without long-term current use of insulin ([place]) 04/10/2016  . Vitamin D deficiency 04/10/2016    Past Surgical History:  Procedure Laterality Date  . CARPAL TUNNEL RELEASE Bilateral   . CERVICAL SPINE SURGERY    . HEMORRHOID SURGERY    . LUMBAR SPINE SURGERY     scraped nerves  . PERIPHERAL NEUROPLASTY OF FINGER / HAND / ARM Bilateral    transposition median nerve at carpal tunnel  . TONSILLECTOMY      Current Medications: Current Meds  Medication Sig  . amoxicillin (AMOXIL) 500 MG capsule Take 4 capsules (2,000 mg total) by mouth daily as needed. Take two hours prior to your dental work.  . Ascorbic Acid (VITAMIN C) 1000 MG tablet Take 2,000 mg by mouth daily.  Marland Kitchen aspirin EC 81 MG tablet Take 1 tablet (81 mg total) by mouth daily.  . carboxymethylcellulose 1 % ophthalmic solution Apply to eye.  . Cholecalciferol 25 MCG (1000 UT) capsule Take 1,000 Units by mouth daily.  . dapagliflozin propanediol (FARXIGA) 10 MG TABS tablet 10 mg daily.  . Evolocumab (REPATHA SURECLICK) 222 MG/ML SOAJ Inject 1 pen into the skin every 14 (fourteen) days.  . fluticasone (FLONASE) 50 MCG/ACT nasal spray 1 spray by Both Nostrils route daily as needed for Rhinitis.  Marland Kitchen lisinopril-hydrochlorothiazide (PRINZIDE,ZESTORETIC) 20-12.5 MG tablet Take 0.5 tablets by mouth daily.  . metFORMIN (GLUMETZA) 500 MG (MOD) 24 hr tablet Take 500 mg by mouth daily with breakfast.  . Red Yeast Rice Extract (RED YEAST RICE PO) Take by mouth in the morning and at bedtime.     Allergies:   Bee venom, Atorvastatin calcium, Ezetimibe, Metformin, Pravastatin, Rosuvastatin, Atorvastatin, and Gabapentin   Social History    Socioeconomic History  . Marital status: Married    Spouse name: Not on file  . Number of children: Not on file  . Years of education: Not on file  . Highest education level: Not on file  Occupational History  . Not on file  Tobacco Use  . Smoking status: Former Smoker    Packs/day: 1.00    Types: Cigarettes    Quit date: 11/25/2012    Years since quitting: 7.1  . Smokeless tobacco: Never Used  Vaping Use  . Vaping Use: Never used  Substance and Sexual Activity  . Alcohol use: Not Currently  . Drug use: Not Currently  . Sexual activity: Not on file  Other Topics Concern  . Not on file  Social History Narrative  . Not on file   Social Determinants of Health   Financial Resource Strain:   . Difficulty of Paying Living Expenses: Not on file  Food Insecurity:   . Worried About Charity fundraiser in the Last Year: Not on file  . Ran Out of Food in the Last Year: Not on file  Transportation Needs:   . Lack of Transportation (Medical): Not on file  . Lack of Transportation (Non-Medical): Not  on file  Physical Activity:   . Days of Exercise per Week: Not on file  . Minutes of Exercise per Session: Not on file  Stress:   . Feeling of Stress : Not on file  Social Connections:   . Frequency of Communication with Friends and Family: Not on file  . Frequency of Social Gatherings with Friends and Family: Not on file  . Attends Religious Services: Not on file  . Active Member of Clubs or Organizations: Not on file  . Attends Archivist Meetings: Not on file  . Marital Status: Not on file     Family History: The patient's family history includes Bone cancer in his mother; Congestive Heart Failure in his mother; Heart attack in his father; Leukemia in his brother. ROS:   Please see the history of present illness.    All other systems reviewed and are negative.  EKGs/Labs/Other Studies Reviewed:    The following studies were reviewed today:    Recent  Labs: 09/23/2019: BUN 21; Creatinine, Ser 1.02; Potassium 4.2; Sodium 139  10/08/2019: Cholesterol 229 LDL 176 HDL  A1c 6.6%  Physical Exam:    VS:  BP 112/68   Pulse (!) 58   Ht 6\' 3"  (1.905 m)   Wt (!) 316 lb (143.3 kg)   SpO2 95%   BMI 39.50 kg/m     Wt Readings from Last 3 Encounters:  01/07/20 (!) 316 lb (143.3 kg)  10/12/19 (!) 312 lb (141.5 kg)  09/23/19 (!) 312 lb 9.6 oz (141.8 kg)     GEN: Obese well nourished, well developed in no acute distress HEENT: Normal NECK: No JVD; No carotid bruits LYMPHATICS: No lymphadenopathy CARDIAC: 1/6 to 2/6 aortic ejection murmur RRR, no murmurs, rubs, gallops RESPIRATORY:  Clear to auscultation without rales, wheezing or rhonchi  ABDOMEN: Soft, non-tender, non-distended MUSCULOSKELETAL:  No edema; No deformity  SKIN: Warm and dry NEUROLOGIC:  Alert and oriented x 3 PSYCHIATRIC:  Normal affect    Signed, Shirlee More, MD  01/07/2020 1:12 PM    [facility]

## 2020-01-07 ENCOUNTER — Other Ambulatory Visit: Payer: Self-pay

## 2020-01-07 ENCOUNTER — Encounter: Payer: Self-pay | Admitting: Cardiology

## 2020-01-07 ENCOUNTER — Ambulatory Visit (INDEPENDENT_AMBULATORY_CARE_PROVIDER_SITE_OTHER): Payer: Medicaid Other | Admitting: Cardiology

## 2020-01-07 VITALS — BP 112/68 | HR 58 | Ht 75.0 in | Wt 316.0 lb

## 2020-01-07 DIAGNOSIS — E782 Mixed hyperlipidemia: Secondary | ICD-10-CM | POA: Diagnosis not present

## 2020-01-07 DIAGNOSIS — I350 Nonrheumatic aortic (valve) stenosis: Secondary | ICD-10-CM

## 2020-01-07 DIAGNOSIS — I25118 Atherosclerotic heart disease of native coronary artery with other forms of angina pectoris: Secondary | ICD-10-CM | POA: Diagnosis not present

## 2020-01-07 DIAGNOSIS — I6521 Occlusion and stenosis of right carotid artery: Secondary | ICD-10-CM

## 2020-01-07 DIAGNOSIS — E119 Type 2 diabetes mellitus without complications: Secondary | ICD-10-CM

## 2020-01-07 DIAGNOSIS — I119 Hypertensive heart disease without heart failure: Secondary | ICD-10-CM | POA: Diagnosis not present

## 2020-01-07 LAB — LIPID PANEL
Chol/HDL Ratio: 3.7 ratio (ref 0.0–5.0)
Cholesterol, Total: 129 mg/dL (ref 100–199)
HDL: 35 mg/dL — ABNORMAL LOW (ref >39)
LDL Chol Calc (NIH): 74 mg/dL (ref 0–99)
Triglycerides: 111 mg/dL (ref 0–149)
VLDL Cholesterol Cal: 20 mg/dL (ref 5–40)

## 2020-01-07 LAB — COMPREHENSIVE METABOLIC PANEL
ALT: 38 IU/L (ref 0–44)
AST: 37 IU/L (ref 0–40)
Albumin/Globulin Ratio: 1.6 (ref 1.2–2.2)
Albumin: 4.3 g/dL (ref 3.8–4.8)
Alkaline Phosphatase: 62 IU/L (ref 44–121)
BUN/Creatinine Ratio: 17 (ref 10–24)
BUN: 21 mg/dL (ref 8–27)
Bilirubin Total: 0.5 mg/dL (ref 0.0–1.2)
CO2: 25 mmol/L (ref 20–29)
Calcium: 9.2 mg/dL (ref 8.6–10.2)
Chloride: 103 mmol/L (ref 96–106)
Creatinine, Ser: 1.24 mg/dL (ref 0.76–1.27)
GFR calc Af Amer: 71 mL/min/{1.73_m2} (ref >59)
GFR calc non Af Amer: 61 mL/min/{1.73_m2} (ref >59)
Globulin, Total: 2.7 g/dL (ref 1.5–4.5)
Glucose: 164 mg/dL — ABNORMAL HIGH (ref 65–99)
Potassium: 4.1 mmol/L (ref 3.5–5.2)
Sodium: 138 mmol/L (ref 134–144)
Total Protein: 7 g/dL (ref 6.0–8.5)

## 2020-01-07 NOTE — Patient Instructions (Signed)

## 2020-05-19 DIAGNOSIS — I700 Atherosclerosis of aorta: Secondary | ICD-10-CM | POA: Insufficient documentation

## 2020-05-19 DIAGNOSIS — R0781 Pleurodynia: Secondary | ICD-10-CM | POA: Insufficient documentation

## 2020-05-19 DIAGNOSIS — K760 Fatty (change of) liver, not elsewhere classified: Secondary | ICD-10-CM | POA: Insufficient documentation

## 2020-07-24 NOTE — Progress Notes (Deleted)
Cardiology Office Note:    Date:  07/24/2020   ID:  Zachary Andrews, DOB 06-10-1955, MRN [id number]  PCP:  Algis Greenhouse, MD  Cardiologist:  Shirlee More, MD    Referring MD: Algis Greenhouse, MD    ASSESSMENT:    No diagnosis found. PLAN:    In order of problems listed above:  1. ***   Next appointment: ***   Medication Adjustments/Labs and Tests Ordered: Current medicines are reviewed at length with the patient today.  Concerns regarding medicines are outlined above.  No orders of the defined types were placed in this encounter.  No orders of the defined types were placed in this encounter.   No chief complaint on file.   History of Present Illness:    Zachary Andrews is a 65 y.o. male with a hx of mild to moderate aortic stenosis hypertensive heart disease without heart failure coronary artery disease with chronic total occlusion of the right coronary artery hyperlipidemia right carotid stenosis and type 2 diabetes last seen 01/07/2020. Compliance with diet, lifestyle and medications: ***  He underwent a myocardial perfusion test 09/23/2019 showing normal left ventricular function EF 63 and normal perfusion it was low risk with no evidence of ischemia.  He did have hypotension during the test and into recovery. Echocardiogram performed 07/10/2019 at peak and mean gradients of 37/21 mmHg and a VTI ratio of 0.41 valve area 1.4 cm   And cerebrovascular duplex performed [facility] 01/12/2020 showing right ICA occlusion and left ICA stenosis less than 60% with antegrade vertebral flow unchanged from previous. Lower extremity ABI performed 01/12/2020 normal bilaterally Past Medical History:  Diagnosis Date  . Atypical nevi 12/21/2016  . Carpal tunnel syndrome, bilateral 06/01/2015  . Central spinal stenosis 12/21/2016  . Chronic bilateral low back pain with left-sided sciatica 07/11/2015  . Coronary artery disease involving native coronary artery of native heart 12/21/2016  .  Elevated LFTs 08/22/2017   ICD-10 cut over  . Frequent falls 10/15/2016  . H/O spinal stenosis 08/22/2017  . Hypertension 02/06/2015  . 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 Lumbar disc disease 08/22/2017  . Lumbar radiculopathy 12/25/2015  . Lumbar spondylosis 04/29/2016  . Lumbar stenosis with neurogenic claudication 12/21/2016  . Microalbuminuria 08/22/2017  . Mixed hyperlipidemia 03/13/2015   Refusing statin  . Nephrolithiasis 12/21/2016  . Neuropathy 08/19/2017   From cervical and lumbar spinal stenosis  And carpal tunnel  . Non-recurrent unilateral inguinal hernia without obstruction or gangrene 12/23/2016   2018  . Numbness 02/06/2015  . Obesity 08/22/2017  . Obstructive sleep apnea on CPAP 04/10/2016   Managed PULM  . Occlusion of right internal carotid artery 12/21/2016   >70%  . 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  . Osteoarthritis of shoulder 12/21/2016  . Pain in left knee 07/08/2017  . Polyp of colon 12/21/2016  . Primary osteoarthritis of left hip 11/19/2016  . Sexual dysfunction 08/22/2017  . Spinal cord compression ([place]) 02/06/2015  . Type 2 diabetes mellitus without complication, without long-term current use of insulin ([place]) 04/10/2016  . Vitamin D deficiency 04/10/2016    Past Surgical History:  Procedure Laterality Date  . CARPAL TUNNEL RELEASE Bilateral   . CERVICAL SPINE SURGERY    . HEMORRHOID SURGERY    . LUMBAR SPINE SURGERY     scraped nerves  . PERIPHERAL NEUROPLASTY OF FINGER / HAND / ARM Bilateral    transposition median nerve at carpal tunnel  . TONSILLECTOMY  Current Medications: No outpatient medications have been marked as taking for the 07/25/20 encounter (Appointment) with Richardo Priest, MD.     Allergies:   Bee venom, Atorvastatin calcium, Ezetimibe, Metformin, Pravastatin, Rosuvastatin, Atorvastatin, and Gabapentin   Social History   Socioeconomic History  . Marital  status: Married    Spouse name: Not on file  . Number of children: Not on file  . Years of education: Not on file  . Highest education level: Not on file  Occupational History  . Not on file  Tobacco Use  . Smoking status: Former Smoker    Packs/day: 1.00    Types: Cigarettes    Quit date: 11/25/2012    Years since quitting: 7.6  . Smokeless tobacco: Never Used  Vaping Use  . Vaping Use: Never used  Substance and Sexual Activity  . Alcohol use: Not Currently  . Drug use: Not Currently  . Sexual activity: Not on file  Other Topics Concern  . Not on file  Social History Narrative  . Not on file   Social Determinants of Health   Financial Resource Strain: Not on file  Food Insecurity: Not on file  Transportation Needs: Not on file  Physical Activity: Not on file  Stress: Not on file  Social Connections: Not on file     Family History: The patient's ***family history includes Bone cancer in his mother; Congestive Heart Failure in his mother; Heart attack in his father; Leukemia in his brother. ROS:   Please see the history of present illness.    All other systems reviewed and are negative.  EKGs/Labs/Other Studies Reviewed:    The following studies were reviewed today:  EKG:  EKG ordered today and personally reviewed.  The ekg ordered today demonstrates ***  Recent Labs: 01/07/2020: ALT 38; BUN 21; Creatinine, Ser 1.24; Potassium 4.1; Sodium 138  Recent Lipid Panel    Component Value Date/Time   CHOL 129 01/07/2020 1034   TRIG 111 01/07/2020 1034   HDL 35 (L) 01/07/2020 1034   CHOLHDL 3.7 01/07/2020 1034   LDLCALC 74 01/07/2020 1034    Physical Exam:    VS:  There were no vitals taken for this visit.    Wt Readings from Last 3 Encounters:  01/07/20 (!) 316 lb (143.3 kg)  10/12/19 (!) 312 lb (141.5 kg)  09/23/19 (!) 312 lb 9.6 oz (141.8 kg)     GEN: *** Well nourished, well developed in no acute distress HEENT: Normal NECK: No JVD; No carotid  bruits LYMPHATICS: No lymphadenopathy CARDIAC: ***RRR, no murmurs, rubs, gallops RESPIRATORY:  Clear to auscultation without rales, wheezing or rhonchi  ABDOMEN: Soft, non-tender, non-distended MUSCULOSKELETAL:  No edema; No deformity  SKIN: Warm and dry NEUROLOGIC:  Alert and oriented x 3 PSYCHIATRIC:  Normal affect    Signed, Shirlee More, MD  07/24/2020 7:37 PM    [facility]

## 2020-07-25 ENCOUNTER — Ambulatory Visit: Payer: Medicaid Other | Admitting: Cardiology

## 2021-04-16 DIAGNOSIS — Z9181 History of falling: Secondary | ICD-10-CM | POA: Insufficient documentation

## 2021-06-25 ENCOUNTER — Encounter: Payer: Self-pay | Admitting: Cardiology

## 2021-06-25 ENCOUNTER — Ambulatory Visit (INDEPENDENT_AMBULATORY_CARE_PROVIDER_SITE_OTHER): Payer: Medicaid Other | Admitting: Cardiology

## 2021-06-25 VITALS — BP 118/78 | HR 65 | Ht 75.0 in | Wt 305.0 lb

## 2021-06-25 DIAGNOSIS — I350 Nonrheumatic aortic (valve) stenosis: Secondary | ICD-10-CM

## 2021-06-25 DIAGNOSIS — M79605 Pain in left leg: Secondary | ICD-10-CM

## 2021-06-25 DIAGNOSIS — M79604 Pain in right leg: Secondary | ICD-10-CM

## 2021-06-25 DIAGNOSIS — E782 Mixed hyperlipidemia: Secondary | ICD-10-CM

## 2021-06-25 DIAGNOSIS — I25118 Atherosclerotic heart disease of native coronary artery with other forms of angina pectoris: Secondary | ICD-10-CM

## 2021-06-25 DIAGNOSIS — I119 Hypertensive heart disease without heart failure: Secondary | ICD-10-CM

## 2021-06-25 NOTE — Progress Notes (Signed)
?Cardiology Office Note:   ? ?Date:  06/25/2021  ? ?ID:  Zachary Andrews, DOB 1955-04-09, MRN [id number] ? ?PCP:  Algis Greenhouse, MD  ?Cardiologist:  Shirlee More, MD   ? ?Referring MD: Algis Greenhouse, MD  ? ? ?ASSESSMENT:   ? ?1. Pain in both lower extremities   ?2. Hypertensive heart disease without heart failure   ?3. Coronary artery disease of native artery of native heart with stable angina pectoris ([place])   ?4. Nonrheumatic aortic valve stenosis   ?5. Mixed hyperlipidemia   ? ?PLAN:   ? ?In order of problems listed above: ? ?I suspect is multifactorial obviously has peripheral arterial disease check ABIs if severely reduced would need further evaluation ?Stable with his peripheral edema check proBNP level ?Stable CAD continue medical therapy having no anginal discomfort aspirin and over-the-counter statin ?Check echocardiogram regarding progression if severe would need to consider intervention ?Check CMP lipid profile may require prescription statin therapy ? ? ?Next appointment: 1 year ? ? ?Medication Adjustments/Labs and Tests Ordered: ?Current medicines are reviewed at length with the patient today.  Concerns regarding medicines are outlined above.  ?No orders of the defined types were placed in this encounter. ? ?No orders of the defined types were placed in this encounter. ? ? ?Chief Complaint  ?Patient presents with  ? Follow-up  ? Aortic Stenosis  ? Coronary Artery Disease  ? ? ?History of Present Illness:   ? ?Zachary Andrews is a 66 y.o. male with a hx of aortic stenosis CAD hypertensive heart disease without heart failure hyperlipidemia  last seen 01/07/2020.He underwent a myocardial perfusion test 09/23/2019 showing normal left ventricular function EF 63 and normal perfusion it was low risk with no evidence of ischemia.  He did have hypotension during the test and into recovery. ? ?Echocardiogram performed 07/10/2019 at peak and mean gradients of 37/21 mmHg and a VTI ratio of 0.41 valve area 1.4  cm? ? ?Compliance with diet, lifestyle and medications: Yes ? ?He is overdue for follow-up but his concern is pain in his lower extremities chronic related to his back it is there at rest with activities he has unsteady gait and it hurts from low back to the feet bilaterally.  His wife is concerned that he has arterial disease ? ?He has a mild degree of edema chronic exertional shortness of breath no orthopnea uses CPAP no chest pain palpitation or syncope ? ?Recent labs 04/10/2020 cholesterol 118 LDL 71 triglycerides 119 HDL 37 ?Past Medical History:  ?Diagnosis Date  ? Atypical nevi 12/21/2016  ? Carpal tunnel syndrome, bilateral 06/01/2015  ? Central spinal stenosis 12/21/2016  ? Chronic bilateral low back pain with left-sided sciatica 07/11/2015  ? Coronary artery disease involving native coronary artery of native heart 12/21/2016  ? Elevated LFTs 08/22/2017  ? ICD-10 cut over  ? Frequent falls 10/15/2016  ? H/O spinal stenosis 08/22/2017  ? Hypertension 02/06/2015  ? Intervertebral disc disorder with radiculopathy of lumbosacral region 10/04/2014  ? Lumbar disc disease 08/22/2017  ? Lumbar radiculopathy 12/25/2015  ? Lumbar spondylosis 04/29/2016  ? Lumbar stenosis with neurogenic claudication 12/21/2016  ? Microalbuminuria 08/22/2017  ? Mixed hyperlipidemia 03/13/2015  ? Refusing statin  ? Nephrolithiasis 12/21/2016  ? Neuropathy 08/19/2017  ? From cervical and lumbar spinal stenosis  And carpal tunnel  ? Non-recurrent unilateral inguinal hernia without obstruction or gangrene 12/23/2016  ? 2018  ? Numbness 02/06/2015  ? Obesity 08/22/2017  ? Obstructive sleep apnea on CPAP 04/10/2016  ?  Managed PULM  ? Occlusion of right internal carotid artery 12/21/2016  ? >70%  ? 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  ? Osteoarthritis of shoulder 12/21/2016  ? Pain in left knee 07/08/2017  ? Polyp of colon 12/21/2016  ? Primary osteoarthritis of left hip 11/19/2016  ? Sexual dysfunction 08/22/2017  ? Spinal cord  compression ([place]) 02/06/2015  ? Type 2 diabetes mellitus without complication, without long-term current use of insulin ([place]) 04/10/2016  ? Vitamin D deficiency 04/10/2016  ? ? ?Past Surgical History:  ?Procedure Laterality Date  ? CARPAL TUNNEL RELEASE Bilateral   ? CERVICAL SPINE SURGERY    ? HEMORRHOID SURGERY    ? LUMBAR SPINE SURGERY    ? scraped nerves  ? PERIPHERAL NEUROPLASTY OF FINGER / HAND / ARM Bilateral   ? transposition median nerve at carpal tunnel  ? TONSILLECTOMY    ? ? ?Current Medications: ?No outpatient medications have been marked as taking for the 06/25/21 encounter (Office Visit) with Richardo Priest, MD.  ?  ? ?Allergies:   Bee venom, Atorvastatin calcium, Dapagliflozin, Ezetimibe, Metformin, Pravastatin, Rosuvastatin, Atorvastatin, and Gabapentin  ? ?Social History  ? ?Socioeconomic History  ? Marital status: Married  ?  Spouse name: Not on file  ? Number of children: Not on file  ? Years of education: Not on file  ? Highest education level: Not on file  ?Occupational History  ? Not on file  ?Tobacco Use  ? Smoking status: Former  ?  Packs/day: 1.00  ?  Types: Cigarettes  ?  Quit date: 11/25/2012  ?  Years since quitting: 8.5  ?  Passive exposure: Past  ? Smokeless tobacco: Never  ?Vaping Use  ? Vaping Use: Never used  ?Substance and Sexual Activity  ? Alcohol use: Not Currently  ? Drug use: Not Currently  ? Sexual activity: Not on file  ?Other Topics Concern  ? Not on file  ?Social History Narrative  ? Not on file  ? ?Social Determinants of Health  ? ?Financial Resource Strain: Not on file  ?Food Insecurity: Not on file  ?Transportation Needs: Not on file  ?Physical Activity: Not on file  ?Stress: Not on file  ?Social Connections: Not on file  ?  ? ?Family History: ?The patient's family history includes Bone cancer in his mother; Congestive Heart Failure in his mother; Heart attack in his father; Leukemia in his brother. ?ROS:   ?Please see the history of present illness.    ?All other systems  reviewed and are negative. ? ?EKGs/Labs/Other Studies Reviewed:   ? ?The following studies were reviewed today: ? ?EKG:  EKG ordered today and personally reviewed.  The ekg ordered today demonstrates sinus rhythm rightward axis otherwise normal EKG ? ?Recent Labs: ?No results found for requested labs within last 8760 hours.  ?Recent Lipid Panel ?   ?Component Value Date/Time  ? CHOL 129 01/07/2020 1034  ? TRIG 111 01/07/2020 1034  ? HDL 35 (L) 01/07/2020 1034  ? CHOLHDL 3.7 01/07/2020 1034  ? [place] 74 01/07/2020 1034  ? ? ?Physical Exam:   ? ?VS:  BP 118/78 (BP Location: Right Arm, Patient Position: Sitting)   Pulse 65   Ht '6\' 3"'$  (1.905 m)   Wt (!) 305 lb (138.3 kg)   SpO2 98%   BMI 38.12 kg/m?    ? ?Wt Readings from Last 3 Encounters:  ?06/25/21 (!) 305 lb (138.3 kg)  ?01/07/20 (!) 316 lb (143.3 kg)  ?10/12/19 (!) 312 lb (  141.5 kg)  ?  ? ?GEN: Obese well nourished, well developed in no acute distress ?HEENT: Normal ?NECK: No JVD; No carotid bruits ?LYMPHATICS: No lymphadenopathy ?CARDIAC: 2/6 aortic stenosis murmur to the clavicular area does not radiate to the carotids S2 normal no aortic regurgitation RRR, ?RESPIRATORY:  Clear to auscultation without rales, wheezing or rhonchi  ?ABDOMEN: Soft, non-tender, non-distended ?MUSCULOSKELETAL: Bilateral lower extremity edema; No deformity he has a palpable dorsalis pedis bilaterally the feet are nonischemic ?SKIN: Warm and dry ?NEUROLOGIC:  Alert and oriented x 3 ?PSYCHIATRIC:  Normal affect  ? ? ?Signed, ?Shirlee More, MD  ?06/25/2021 2:48 PM    ?[place]  ?

## 2021-06-25 NOTE — Patient Instructions (Signed)
Medication Instructions:  ?Your physician recommends that you continue on your current medications as directed. Please refer to the Current Medication list given to you today. ? ?*If you need a refill on your cardiac medications before your next appointment, please call your pharmacy* ? ? ?Lab Work: ?Your physician recommends that you return for lab work in:  ? ?Labs today in [address]: CMP, Lipid profile, Pro BNP ? ?If you have labs (blood work) drawn today and your tests are completely normal, you will receive your results only by: ?MyChart Message (if you have MyChart) OR ?A paper copy in the mail ?If you have any lab test that is abnormal or we need to change your treatment, we will call you to review the results. ? ? ?Testing/Procedures: ?Your physician has requested that you have a lower or upper extremity arterial duplex. This test is an ultrasound of the arteries in the legs or arms. It looks at arterial blood flow in the legs and arms. Allow one hour for Lower and Upper Arterial scans. There are no restrictions or special instructions  ? ?Your physician has requested that you have an echocardiogram. Echocardiography is a painless test that uses sound waves to create images of your heart. It provides your doctor with information about the size and shape of your heart and how well your heart?s chambers and valves are working. This procedure takes approximately one hour. There are no restrictions for this procedure. ? ? ? ?Follow-Up: ?At [facility], you and your health needs are our priority.  As part of our continuing mission to provide you with exceptional heart care, we have created designated Provider Care Teams.  These Care Teams include your primary Cardiologist (physician) and Advanced Practice Providers (APPs -  Physician Assistants and Nurse Practitioners) who all work together to provide you with the care you need, when you need it. ? ?We recommend signing up for the patient portal called  "MyChart".  Sign up information is provided on this After Visit Summary.  MyChart is used to connect with patients for Virtual Visits (Telemedicine).  Patients are able to view lab/test results, encounter notes, upcoming appointments, etc.  Non-urgent messages can be sent to your provider as well.   ?To learn more about what you can do with MyChart, go to [email or web address].   ? ?Your next appointment:   ?1 year(s) ? ?The format for your next appointment:   ?In Person ? ?Provider:   ?Shirlee More, MD  ? ? ?Other Instructions ?None ? ?Important Information About [place] ? ? ? ? ? ? ?

## 2021-06-26 ENCOUNTER — Telehealth: Payer: Self-pay | Admitting: Cardiology

## 2021-06-26 ENCOUNTER — Other Ambulatory Visit: Payer: Self-pay

## 2021-06-26 DIAGNOSIS — E782 Mixed hyperlipidemia: Secondary | ICD-10-CM

## 2021-06-26 LAB — COMPREHENSIVE METABOLIC PANEL
ALT: 35 IU/L (ref 0–44)
AST: 31 IU/L (ref 0–40)
Albumin/Globulin Ratio: 1.8 (ref 1.2–2.2)
Albumin: 4.2 g/dL (ref 3.8–4.8)
Alkaline Phosphatase: 68 IU/L (ref 44–121)
BUN/Creatinine Ratio: 16 (ref 10–24)
BUN: 22 mg/dL (ref 8–27)
Bilirubin Total: 0.5 mg/dL (ref 0.0–1.2)
CO2: 28 mmol/L (ref 20–29)
Calcium: 9.5 mg/dL (ref 8.6–10.2)
Chloride: 104 mmol/L (ref 96–106)
Creatinine, Ser: 1.37 mg/dL — ABNORMAL HIGH (ref 0.76–1.27)
Globulin, Total: 2.4 g/dL (ref 1.5–4.5)
Glucose: 96 mg/dL (ref 70–99)
Potassium: 4.6 mmol/L (ref 3.5–5.2)
Sodium: 143 mmol/L (ref 134–144)
Total Protein: 6.6 g/dL (ref 6.0–8.5)
eGFR: 57 mL/min/{1.73_m2} — ABNORMAL LOW (ref >59)

## 2021-06-26 LAB — LIPID PANEL
Chol/HDL Ratio: 6.1 ratio — ABNORMAL HIGH (ref 0.0–5.0)
Cholesterol, Total: 200 mg/dL — ABNORMAL HIGH (ref 100–199)
HDL: 33 mg/dL — ABNORMAL LOW (ref >39)
LDL Chol Calc (NIH): 135 mg/dL — ABNORMAL HIGH (ref 0–99)
Triglycerides: 177 mg/dL — ABNORMAL HIGH (ref 0–149)
VLDL Cholesterol Cal: 32 mg/dL (ref 5–40)

## 2021-06-26 LAB — PRO B NATRIURETIC PEPTIDE: NT-Pro BNP: 47 pg/mL (ref 0–376)

## 2021-06-26 NOTE — Telephone Encounter (Signed)
Patient informed of results and the lipid lab draw ordered in Epic. ?

## 2021-06-26 NOTE — Telephone Encounter (Signed)
Patient returning call for lab results. 

## 2021-07-03 ENCOUNTER — Other Ambulatory Visit: Payer: Self-pay

## 2021-07-04 ENCOUNTER — Ambulatory Visit (INDEPENDENT_AMBULATORY_CARE_PROVIDER_SITE_OTHER): Payer: Medicare Other

## 2021-07-04 ENCOUNTER — Other Ambulatory Visit: Payer: Self-pay

## 2021-07-04 DIAGNOSIS — I350 Nonrheumatic aortic (valve) stenosis: Secondary | ICD-10-CM

## 2021-07-04 DIAGNOSIS — E782 Mixed hyperlipidemia: Secondary | ICD-10-CM

## 2021-07-04 DIAGNOSIS — I25118 Atherosclerotic heart disease of native coronary artery with other forms of angina pectoris: Secondary | ICD-10-CM

## 2021-07-04 DIAGNOSIS — M79604 Pain in right leg: Secondary | ICD-10-CM

## 2021-07-04 DIAGNOSIS — M79605 Pain in left leg: Secondary | ICD-10-CM

## 2021-07-04 DIAGNOSIS — I119 Hypertensive heart disease without heart failure: Secondary | ICD-10-CM

## 2021-07-04 LAB — ECHOCARDIOGRAM COMPLETE
AR max vel: 0.99 cm2
AV Area VTI: 1.11 cm2
AV Area mean vel: 0.98 cm2
AV Mean grad: 24 mmHg
AV Peak grad: 42.3 mmHg
Ao pk vel: 3.25 m/s
Area-P 1/2: 4.04 cm2
S' Lateral: 3.4 cm

## 2021-07-05 ENCOUNTER — Telehealth: Payer: Self-pay

## 2021-07-05 LAB — LIPID PANEL
Chol/HDL Ratio: 3.9 ratio (ref 0.0–5.0)
Cholesterol, Total: 129 mg/dL (ref 100–199)
HDL: 33 mg/dL — ABNORMAL LOW (ref >39)
LDL Chol Calc (NIH): 74 mg/dL (ref 0–99)
Triglycerides: 123 mg/dL (ref 0–149)
VLDL Cholesterol Cal: 22 mg/dL (ref 5–40)

## 2021-07-05 NOTE — Telephone Encounter (Signed)
Called patient to inform him of his echo results. Patient did not answer the phone and the voice mailbox was full and unable to leave a message ?

## 2021-07-05 NOTE — Telephone Encounter (Signed)
Called patient to inform him of his echo results below: ? ?"Echo with moderate aortic stenosis, put in recall to recheck 1 year" ? ? Patient did not answer the phone and the voice mailbox was full and unable to leave a message ?

## 2021-07-09 ENCOUNTER — Telehealth: Payer: Self-pay

## 2021-07-09 NOTE — Telephone Encounter (Signed)
Patient informed of the results of his echo. ?

## 2021-09-13 DIAGNOSIS — E1142 Type 2 diabetes mellitus with diabetic polyneuropathy: Secondary | ICD-10-CM | POA: Insufficient documentation

## 2021-09-29 DIAGNOSIS — L600 Ingrowing nail: Secondary | ICD-10-CM | POA: Insufficient documentation

## 2021-09-29 DIAGNOSIS — M79674 Pain in right toe(s): Secondary | ICD-10-CM | POA: Insufficient documentation

## 2021-10-23 DIAGNOSIS — N1831 Chronic kidney disease, stage 3a: Secondary | ICD-10-CM | POA: Insufficient documentation

## 2022-01-31 DIAGNOSIS — J342 Deviated nasal septum: Secondary | ICD-10-CM | POA: Insufficient documentation

## 2022-02-07 DIAGNOSIS — K091 Developmental (nonodontogenic) cysts of oral region: Secondary | ICD-10-CM | POA: Insufficient documentation

## 2022-05-09 DIAGNOSIS — Z0000 Encounter for general adult medical examination without abnormal findings: Secondary | ICD-10-CM | POA: Insufficient documentation

## 2022-05-27 DIAGNOSIS — Z87891 Personal history of nicotine dependence: Secondary | ICD-10-CM | POA: Insufficient documentation

## 2022-05-27 NOTE — Progress Notes (Unsigned)
Cardiology Office Note:    Date:  05/28/2022   ID:  Zachary Andrews, DOB September 18, 1955, MRN [id number]  PCP:  Algis Greenhouse, MD  Cardiologist:  Shirlee More, MD    Referring MD: Algis Greenhouse, MD    ASSESSMENT:    1. Nonrheumatic aortic valve stenosis   2. Coronary artery disease of native artery of native heart with stable angina pectoris   3. Hypertensive heart disease without heart failure   4. Mixed hyperlipidemia    PLAN:    In order of problems listed above:  Clinically he is edematous short of breath and I would say that he has heart failure associated with hypertensive heart disease I will start a low-dose loop diuretic for 2 weeks recheck BMP and proBNP.  Will continue his current antihypertensive His aortic stenosis has progressed to define his mild to moderate does not need valvular intervention at this time and recheck recheck in 1 year Stable angina I will give him a new prescription for nitroglycerin and told the patient that if his shortness of breath ever is severe or does not improve with rest to take nitroglycerin Continue his PCSK9 inhibitor  Next appointment: 6 months   Medication Adjustments/Labs and Tests Ordered: Current medicines are reviewed at length with the patient today.  Concerns regarding medicines are outlined above.  No orders of the defined types were placed in this encounter.  No orders of the defined types were placed in this encounter.   Chief complaint of having shortness of breath   History of Present Illness:    Zachary Andrews is a 67 y.o. male with a hx of mild aortic stenosis CAD hypertensive heart disease without heart failure and hyper lipidemia last seen 06/25/2021.  Echocardiogram 07/04/2021 showed moderate concentric LVH normal left ventricular ejection fraction 60 to 65% right ventricle normal size function and normal pulmonary artery pressure moderate aortic stenosis was seen with mild enlargement ascending aorta and mild left  atrial enlargement.  Aortic valve peak and mean gradients of 42 and 24 mmHg calculated valve area 1.11 cm..  With lower extremity pain ABIs are performed normal bilaterally reported 07/05/2021.  Compliance with diet, lifestyle and medications: Yes  His wife is present participates in evaluation decision making He is fairly sedentary over the weekend he walked a longer distance including uphill on stairs and himself breathless afterwards there is no chest discomfort His wife is noticed that he now has peripheral edema He has had no chest pain orthopnea palpitation or syncope and sleeps with CPAP 8 hours And increasing the dose of his PCSK9 inhibitor after recent labs in his PCP office Past Medical History:  Diagnosis Date   Atypical nevi 12/21/2016   Carpal tunnel syndrome, bilateral 06/01/2015   Central spinal stenosis 12/21/2016   Chronic bilateral low back pain with left-sided sciatica 07/11/2015   Coronary artery disease involving native coronary artery of native heart 12/21/2016   Elevated LFTs 08/22/2017   ICD-10 cut over   Frequent falls 10/15/2016   H/O spinal stenosis 08/22/2017   Hypertension 02/06/2015   Intervertebral disc disorder with radiculopathy of lumbosacral region 10/04/2014   Lumbar disc disease 08/22/2017   Lumbar radiculopathy 12/25/2015   Lumbar spondylosis 04/29/2016   Lumbar stenosis with neurogenic claudication 12/21/2016   Microalbuminuria 08/22/2017   Mixed hyperlipidemia 03/13/2015   Refusing statin   Nephrolithiasis 12/21/2016   Neuropathy 08/19/2017   From cervical and lumbar spinal stenosis  And carpal tunnel   Non-recurrent unilateral inguinal hernia without  obstruction or gangrene 12/23/2016   2018   Numbness 02/06/2015   Obesity 08/22/2017   Obstructive sleep apnea on CPAP 04/10/2016   Managed PULM   Occlusion of right internal carotid artery 12/21/2016   >70%   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   Osteoarthritis of shoulder  12/21/2016   Pain in left knee 07/08/2017   Polyp of colon 12/21/2016   Primary osteoarthritis of left hip 11/19/2016   Sexual dysfunction 08/22/2017   Spinal cord compression 02/06/2015   Type 2 diabetes mellitus without complication, without long-term current use of insulin 04/10/2016   Vitamin D deficiency 04/10/2016    Past Surgical History:  Procedure Laterality Date   CARPAL TUNNEL RELEASE Bilateral    CERVICAL SPINE SURGERY     HEMORRHOID SURGERY     LUMBAR SPINE SURGERY     scraped nerves   PERIPHERAL NEUROPLASTY OF FINGER / HAND / ARM Bilateral    transposition median nerve at carpal tunnel   TONSILLECTOMY      Current Medications: Current Meds  Medication Sig   amoxicillin (AMOXIL) 500 MG capsule Take 4 capsules (2,000 mg total) by mouth daily as needed. Take two hours prior to your dental work.   Ascorbic Acid (VITAMIN C) 1000 MG tablet Take 2,000 mg by mouth daily.   aspirin EC 81 MG tablet Take 1 tablet (81 mg total) by mouth daily.   carboxymethylcellulose 1 % ophthalmic solution Apply to eye.   Dulaglutide (TRULICITY) 4.5 0000000 SOPN Inject 4.5 mg into the skin every 7 (seven) days.   Evolocumab (REPATHA SURECLICK) [id number] MG/ML SOAJ Inject 1 pen into the skin every 14 (fourteen) days.   fluticasone (FLONASE) 50 MCG/ACT nasal spray 1 spray by Both Nostrils route daily as needed for Rhinitis.   lisinopril-hydrochlorothiazide (PRINZIDE,ZESTORETIC) 20-12.5 MG tablet Take 0.5 tablets by mouth daily.     Allergies:   Bee venom, Atorvastatin calcium, Ezetimibe, Dapagliflozin, Metformin, Pravastatin, Rosuvastatin, Atorvastatin, and Gabapentin   Social History   Socioeconomic History   Marital status: Married    Spouse name: Not on file   Number of children: Not on file   Years of education: Not on file   Highest education level: Not on file  Occupational History   Not on file  Tobacco Use   Smoking status: Former    Packs/day: 1    Types: Cigarettes    Quit date:  11/25/2012    Years since quitting: 9.5    Passive exposure: Past   Smokeless tobacco: Never  Vaping Use   Vaping Use: Never used  Substance and Sexual Activity   Alcohol use: Not Currently   Drug use: Not Currently   Sexual activity: Not on file  Other Topics Concern   Not on file  Social History Narrative   Not on file   Social Determinants of Health   Financial Resource Strain: Not on file  Food Insecurity: Not on file  Transportation Needs: Not on file  Physical Activity: Not on file  Stress: Not on file  Social Connections: Not on file     Family History: The patient's family history includes Bone cancer in his mother; Congestive Heart Failure in his mother; Heart attack in his father; Leukemia in his brother. ROS:   Please see the history of present illness.    All other systems reviewed and are negative.  EKGs/Labs/Other Studies Reviewed:    The following studies were reviewed today:  Cardiac Studies & Procedures  STRESS TESTS  MYOCARDIAL PERFUSION IMAGING 10/14/2019  Narrative  The left ventricular ejection fraction is normal (55-65%).  Nuclear stress EF: 63%.  There was no ST segment deviation noted during stress.  This is a low risk study.   ECHOCARDIOGRAM  ECHOCARDIOGRAM COMPLETE 07/04/2021  Narrative ECHOCARDIOGRAM REPORT    Patient Name:   Alec Hand Date of Exam: 07/04/2021 Medical Rec #:  [id number]  Height:       75.0 in Accession #:    [id number] Weight:       305.0 lb Date of Birth:  12/30/1955 BSA:          2.625 m Patient Age:    51 years   BP:           118/78 mmHg Patient Gender: M          HR:           67 bpm. Exam Location:  [place]  Procedure: 2D Echo, Cardiac Doppler, Color Doppler and Strain Analysis  Indications:    Aortic stenosis I35.0  History:        Patient has prior history of Echocardiogram examinations, most recent 07/07/2019. CAD, Pulmonary embolism, Aortic Valve Disease, Signs/Symptoms:Hypertensive Heart  Disease; Risk Factors:Diabetes and Dyslipidemia.  Sonographer:    Luane School RDCS Referring Phys: [id number] [place]   1. Left ventricular ejection fraction, by estimation, is 60 to 65%. The left ventricle has normal function. The left ventricle has no regional wall motion abnormalities. There is moderate left ventricular hypertrophy. Left ventricular diastolic parameters are consistent with Grade I diastolic dysfunction (impaired relaxation). 2. Right ventricular systolic function is normal. The right ventricular size is normal. There is normal pulmonary artery systolic pressure. 3. Left atrial size was mildly dilated. 4. The mitral valve is normal in structure. No evidence of mitral valve regurgitation. No evidence of mitral stenosis. 5. The aortic valve was not well visualized. Aortic valve regurgitation is not visualized. Moderate aortic valve stenosis. 6. Aneurysm of the ascending aorta, measuring 40 mm. 7. The inferior vena cava is normal in size with greater than 50% respiratory variability, suggesting right atrial pressure of 3 mmHg.  FINDINGS Left Ventricle: Left ventricular ejection fraction, by estimation, is 60 to 65%. The left ventricle has normal function. The left ventricle has no regional wall motion abnormalities. The left ventricular internal cavity size was normal in size. There is moderate left ventricular hypertrophy. Left ventricular diastolic parameters are consistent with Grade I diastolic dysfunction (impaired relaxation).  Right Ventricle: The right ventricular size is normal. No increase in right ventricular wall thickness. Right ventricular systolic function is normal. There is normal pulmonary artery systolic pressure. The tricuspid regurgitant velocity is 2.08 m/s, and with an assumed right atrial pressure of 3 mmHg, the estimated right ventricular systolic pressure is 0000000 mmHg.  Left Atrium: Left atrial size was mildly dilated.  Right Atrium:  Right atrial size was normal in size.  Pericardium: There is no evidence of pericardial effusion.  Mitral Valve: The mitral valve is normal in structure. No evidence of mitral valve regurgitation. No evidence of mitral valve stenosis.  Tricuspid Valve: The tricuspid valve is normal in structure. Tricuspid valve regurgitation is not demonstrated. No evidence of tricuspid stenosis.  Aortic Valve: The aortic valve was not well visualized. Aortic valve regurgitation is not visualized. Moderate aortic stenosis is present. Aortic valve mean gradient measures 24.0 mmHg. Aortic valve peak gradient measures 42.2 mmHg. Aortic valve area, by VTI measures 1.11 cm.  Pulmonic Valve: The pulmonic valve was normal in structure. Pulmonic valve regurgitation is not visualized. No evidence of pulmonic stenosis.  Aorta: The aortic root is normal in size and structure. There is an aneurysm involving the ascending aorta measuring 40 mm.  Venous: The inferior vena cava is normal in size with greater than 50% respiratory variability, suggesting right atrial pressure of 3 mmHg.  IAS/Shunts: No atrial level shunt detected by color flow Doppler.   LEFT VENTRICLE PLAX 2D LVIDd:         5.20 cm   Diastology LVIDs:         3.40 cm   LV e' medial:    5.98 cm/s LV PW:         1.50 cm   LV E/e' medial:  12.5 LV IVS:        1.50 cm   LV e' lateral:   7.49 cm/s LVOT diam:     2.00 cm   LV E/e' lateral: 10.0 LV SV:         75 LV SV Index:   28 LVOT Area:     3.14 cm   RIGHT VENTRICLE             IVC RV S prime:     16.60 cm/s  IVC diam: 1.40 cm TAPSE (M-mode): 2.8 cm  LEFT ATRIUM             Index        RIGHT ATRIUM           Index LA diam:        4.10 cm 1.56 cm/m   RA Area:     21.60 cm LA Vol (A2C):   62.8 ml 23.92 ml/m  RA Volume:   66.80 ml  25.44 ml/m LA Vol (A4C):   62.6 ml 23.84 ml/m LA Biplane Vol: 65.7 ml 25.02 ml/m AORTIC VALVE AV Area (Vmax):    0.99 cm AV Area (Vmean):   0.98 cm AV  Area (VTI):     1.11 cm AV Vmax:           325.00 cm/s AV Vmean:          226.000 cm/s AV VTI:            0.675 m AV Peak Grad:      42.2 mmHg AV Mean Grad:      24.0 mmHg LVOT Vmax:         102.00 cm/s LVOT Vmean:        70.800 cm/s LVOT VTI:          0.238 m LVOT/AV VTI ratio: 0.35  AORTA Ao Root diam: 3.70 cm Ao Asc diam:  4.00 cm Ao Desc diam: 2.10 cm  MITRAL VALVE               TRICUSPID VALVE MV Area (PHT): 4.04 cm    TR Peak grad:   17.3 mmHg MV Decel Time: 188 msec    TR Vmax:        208.00 cm/s MV E velocity: 74.60 cm/s MV A velocity: 84.00 cm/s  SHUNTS MV E/A ratio:  0.89        Systemic VTI:  0.24 m Systemic Diam: 2.00 cm  Jyl Heinz MD Electronically signed by Jyl Heinz MD Signature Date/Time: 07/04/2021/4:21:08 PM    Final             EKG:  EKG ordered today and personally reviewed.  The ekg ordered today  demonstrates sinus rhythm within normal limit EKG  Recent Labs: 06/25/2021: ALT 35; BUN 22; Creatinine, Ser 1.37; NT-Pro BNP 47; Potassium 4.6; Sodium 143  Recent Lipid Panel    Component Value Date/Time   CHOL 129 07/04/2021 1525   TRIG 123 07/04/2021 1525   HDL 33 (L) 07/04/2021 1525   CHOLHDL 3.9 07/04/2021 1525   LDLCALC 74 07/04/2021 1525    Physical Exam:    VS:  BP 112/68 (BP Location: Right Arm, Patient Position: Sitting)   Pulse 68   Ht 6\' 3"  (1.905 m)   Wt (!) 304 lb (137.9 kg)   SpO2 95%   BMI 38.00 kg/m     Wt Readings from Last 3 Encounters:  05/28/22 (!) 304 lb (137.9 kg)  06/25/21 (!) 305 lb (138.3 kg)  01/07/20 (!) 316 lb (143.3 kg)     GEN: Obese well nourished, well developed in no acute distress HEENT: Normal NECK: No JVD; No carotid bruits LYMPHATICS: No lymphadenopathy CARDIAC: Grade 2/6 harsh systolic ejection murmur left lower sternal border to the aortic area S2 normal no AR RRR,RESPIRATORY:  Clear to auscultation without rales, wheezing or rhonchi  ABDOMEN: Soft, non-tender,  non-distended MUSCULOSKELETAL:  No edema; No deformity  SKIN: Warm and dry NEUROLOGIC:  Alert and oriented x 3 PSYCHIATRIC:  Normal affect    Signed, Shirlee More, MD  05/28/2022 11:25 AM    [facility]

## 2022-05-28 ENCOUNTER — Encounter: Payer: Self-pay | Admitting: Cardiology

## 2022-05-28 ENCOUNTER — Ambulatory Visit: Payer: 59 | Attending: Cardiology | Admitting: Cardiology

## 2022-05-28 VITALS — BP 112/68 | HR 68 | Ht 75.0 in | Wt 304.0 lb

## 2022-05-28 DIAGNOSIS — I25118 Atherosclerotic heart disease of native coronary artery with other forms of angina pectoris: Secondary | ICD-10-CM | POA: Diagnosis not present

## 2022-05-28 DIAGNOSIS — I119 Hypertensive heart disease without heart failure: Secondary | ICD-10-CM | POA: Diagnosis not present

## 2022-05-28 DIAGNOSIS — I350 Nonrheumatic aortic (valve) stenosis: Secondary | ICD-10-CM

## 2022-05-28 DIAGNOSIS — E782 Mixed hyperlipidemia: Secondary | ICD-10-CM | POA: Diagnosis not present

## 2022-05-28 MED ORDER — FUROSEMIDE 20 MG PO TABS: 20.0000 mg | ORAL_TABLET | Freq: Every day | ORAL | 3 refills | Status: DC

## 2022-05-28 MED ORDER — NITROGLYCERIN 0.4 MG SL SUBL: 0.4000 mg | SUBLINGUAL_TABLET | SUBLINGUAL | 1 refills | Status: DC | PRN

## 2022-05-28 NOTE — Patient Instructions (Signed)
Medication Instructions:  Your physician has recommended you make the following change in your medication:   START: Furosemide 20 mg daily START: Nitro 0.4 mg under the tongue every 5 minutes as needed for chest pain  *If you need a refill on your cardiac medications before your next appointment, please call your pharmacy*   Lab Work: Your physician recommends that you return for lab work in:   Labs in 2 weeks: BMP, Pro BNP  If you have labs (blood work) drawn today and your tests are completely normal, you will receive your results only by: [place] (if you have MyChart) OR A paper copy in the mail If you have any lab test that is abnormal or we need to change your treatment, we will call you to review the results.   Testing/Procedures: None   Follow-Up: At [facility], you and your health needs are our priority.  As part of our continuing mission to provide you with exceptional heart care, we have created designated Provider Care Teams.  These Care Teams include your primary Cardiologist (physician) and Advanced Practice Providers (APPs -  Physician Assistants and Nurse Practitioners) who all work together to provide you with the care you need, when you need it.  We recommend signing up for the patient portal called "MyChart".  Sign up information is provided on this After Visit Summary.  MyChart is used to connect with patients for Virtual Visits (Telemedicine).  Patients are able to view lab/test results, encounter notes, upcoming appointments, etc.  Non-urgent messages can be sent to your provider as well.   To learn more about what you can do with MyChart, go to [email or web address].    Your next appointment:   6 month(s)  Provider:   Shirlee More, MD    Other Instructions None

## 2022-06-06 ENCOUNTER — Telehealth: Payer: Self-pay | Admitting: Cardiology

## 2022-06-06 ENCOUNTER — Other Ambulatory Visit: Payer: Self-pay

## 2022-06-06 MED ORDER — FUROSEMIDE 20 MG PO TABS: 20.0000 mg | ORAL_TABLET | Freq: Every day | ORAL | 3 refills | Status: DC

## 2022-06-06 NOTE — Telephone Encounter (Signed)
Rx sent to [facility]

## 2022-06-06 NOTE — Telephone Encounter (Signed)
*  STAT* If patient is at the pharmacy, call can be transferred to refill team.   1. Which medications need to be refilled? (please list name of each medication and dose if known)   furosemide (LASIX) 20 MG tablet   2. Which pharmacy/location (including street and city if local pharmacy) is medication to be sent to?  [facility] - [place], [place] - [address] [place]   3. Do they need a 30 day or 90 day supply?  90 day  Caller stated patient is completely out of this medication.  Caller stated patient's prescription was sent to [facility] and they need this sent to [facility].

## 2022-10-03 ENCOUNTER — Telehealth: Payer: Self-pay | Admitting: *Deleted

## 2022-10-03 ENCOUNTER — Telehealth: Payer: Self-pay

## 2022-10-03 NOTE — Telephone Encounter (Signed)
  Patient Consent for Virtual Visit        Zachary Andrews has provided verbal consent on 10/03/2022 for a virtual visit (video or telephone).   CONSENT FOR VIRTUAL VISIT FOR:  Zachary Andrews  By participating in this virtual visit I agree to the following:  I hereby voluntarily request, consent and authorize [facility] and its employed or contracted physicians, physician assistants, nurse practitioners or other licensed health care professionals (the Practitioner), to provide me with telemedicine health care services (the "Services") as deemed necessary by the treating Practitioner. I acknowledge and consent to receive the Services by the Practitioner via telemedicine. I understand that the telemedicine visit will involve communicating with the Practitioner through live audiovisual communication technology and the disclosure of certain medical information by electronic transmission. I acknowledge that I have been given the opportunity to request an in-person assessment or other available alternative prior to the telemedicine visit and am voluntarily participating in the telemedicine visit.  I understand that I have the right to withhold or withdraw my consent to the use of telemedicine in the course of my care at any time, without affecting my right to future care or treatment, and that the Practitioner or I may terminate the telemedicine visit at any time. I understand that I have the right to inspect all information obtained and/or recorded in the course of the telemedicine visit and may receive copies of available information for a reasonable fee.  I understand that some of the potential risks of receiving the Services via telemedicine include:  Delay or interruption in medical evaluation due to technological equipment failure or disruption; Information transmitted may not be sufficient (e.g. poor resolution of images) to allow for appropriate medical decision making by the Practitioner; and/or   In rare instances, security protocols could fail, causing a breach of personal health information.  Furthermore, I acknowledge that it is my responsibility to provide information about my medical history, conditions and care that is complete and accurate to the best of my ability. I acknowledge that Practitioner's advice, recommendations, and/or decision may be based on factors not within their control, such as incomplete or inaccurate data provided by me or distortions of diagnostic images or specimens that may result from electronic transmissions. I understand that the practice of medicine is not an exact science and that Practitioner makes no warranties or guarantees regarding treatment outcomes. I acknowledge that a copy of this consent can be made available to me via my patient portal [facility] MyChart), or I can request a printed copy by calling the office of [facility].    I understand that my insurance will be billed for this visit.   I have read or had this consent read to me. I understand the contents of this consent, which adequately explains the benefits and risks of the Services being provided via telemedicine.  I have been provided ample opportunity to ask questions regarding this consent and the Services and have had my questions answered to my satisfaction. I give my informed consent for the services to be provided through the use of telemedicine in my medical care

## 2022-10-03 NOTE — Telephone Encounter (Signed)
Left msg for pt to return call.

## 2022-10-03 NOTE — Telephone Encounter (Signed)
   Name: Zachary Andrews  DOB: [birth year]  MRN: [id number]  Primary Cardiologist: Dr. Dulce Sellar    Preoperative team, please contact this patient and set up a phone call appointment for further preoperative risk assessment. Please obtain consent and complete medication review. Thank you for your help. Last seen on May 28, 2022.  I confirm that guidance regarding antiplatelet and oral anticoagulation therapy has been completed and, if necessary, noted below  Per office protocol, if patient is without any new symptoms or concerns at the time of their virtual visit, he/she may hold ASA  for 7 days prior to procedure. Please resume ASA as soon as possible postprocedure, at the discretion of the surgeon.     Joni Reining, NP 10/03/2022, 1:24 PM [facility]

## 2022-10-03 NOTE — Telephone Encounter (Signed)
Patients significant other, Britta Mccreedy returned call, okay per DPR. Scheduled pt for pre op telehealth visit on 10/16/22 at 9:40 AM. Will route to requesting surgeons office to make them aware.

## 2022-10-03 NOTE — Telephone Encounter (Signed)
   Pre-operative Risk Assessment    Patient Name: Zachary Andrews  DOB: 1955/09/04 MRN: [id number]      Request for Surgical Clearance    Procedure:   L1-L3 compression  Date of Surgery:  Clearance 11/11/22                                 Surgeon:  Dr. Oletha Cruel Surgeon's Group or Practice Name:  [facility] Phone number:  [phone] Fax number:  [phone]   Type of Clearance Requested:   - Medical    Type of Anesthesia:  Not Indicated   Additional requests/questions:   If patient is taking any blood thinners they require them to stop those 7 days pre-op and can resume 5 days post op.  Signed, Dione Housekeeper   10/03/2022, 8:31 AM

## 2022-10-16 ENCOUNTER — Ambulatory Visit: Payer: 59 | Attending: Internal Medicine

## 2022-10-16 DIAGNOSIS — Z01810 Encounter for preprocedural cardiovascular examination: Secondary | ICD-10-CM | POA: Diagnosis not present

## 2022-10-16 NOTE — Progress Notes (Signed)
Virtual Visit via Telephone Note   Because of Zachary Andrews's co-morbid illnesses, he is at least at moderate risk for complications without adequate follow up.  This format is felt to be most appropriate for this patient at this time.  The patient did not have access to video technology/had technical difficulties with video requiring transitioning to audio format only (telephone).  All issues noted in this document were discussed and addressed.  No physical exam could be performed with this format.  Please refer to the patient's chart for his consent to telehealth for [facility].  Evaluation Performed:  Preoperative cardiovascular risk assessment _____________   Date:  10/16/2022   Patient ID:  Zachary Andrews, DOB [birth year], MRN [id number] Patient Location:  Home Provider location:   Office  Primary Care Provider:  Olive Bass, MD Primary Cardiologist:  None  Chief Complaint / Patient Profile   67 y.o. y/o male with a h/o CAD, HTN, HLD, mild AS, OSA (on CPAP), carotid stenosis, DM type II who is pending lumbar decompression L1-L3 and presents today for telephonic preoperative cardiovascular risk assessment.  History of Present Illness    Zachary Andrews is a 67 y.o. male who presents via audio/video conferencing for a telehealth visit today.  Pt was last seen in cardiology clinic on 05/28/2022 by Dr. Dulce Sellar. At that time Calogero Boose was doing well some stable angina and shortness of breath.  He was started on low-dose diuretic prescription for nitroglycerin. The patient is now pending procedure as outlined above. Since his last visit, he is doing much better with no recurrence of chest pain or shortness of breath.  He monitors his vitals daily and blood pressures have been running in the 120s over 80s.  He denies chest pain, shortness of breath, lower extremity edema, fatigue, palpitations, melena, hematuria, hemoptysis, diaphoresis, weakness, presyncope, syncope, orthopnea, and PND.     Past Medical History    Past Medical History:  Diagnosis Date   Atypical nevi 12/21/2016   Carpal tunnel syndrome, bilateral 06/01/2015   Central spinal stenosis 12/21/2016   Chronic bilateral low back pain with left-sided sciatica 07/11/2015   Coronary artery disease involving native coronary artery of native heart 12/21/2016   Elevated LFTs 08/22/2017   ICD-10 cut over   Frequent falls 10/15/2016   H/O spinal stenosis 08/22/2017   Hypertension 02/06/2015   Intervertebral disc disorder with radiculopathy of lumbosacral region 10/04/2014   Lumbar disc disease 08/22/2017   Lumbar radiculopathy 12/25/2015   Lumbar spondylosis 04/29/2016   Lumbar stenosis with neurogenic claudication 12/21/2016   Microalbuminuria 08/22/2017   Mixed hyperlipidemia 03/13/2015   Refusing statin   Nephrolithiasis 12/21/2016   Neuropathy 08/19/2017   From cervical and lumbar spinal stenosis  And carpal tunnel   Non-recurrent unilateral inguinal hernia without obstruction or gangrene 12/23/2016   2018   Numbness 02/06/2015   Obesity 08/22/2017   Obstructive sleep apnea on CPAP 04/10/2016   Managed PULM   Occlusion of right internal carotid artery 12/21/2016   >70%   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   Osteoarthritis of shoulder 12/21/2016   Pain in left knee 07/08/2017   Polyp of colon 12/21/2016   Primary osteoarthritis of left hip 11/19/2016   Sexual dysfunction 08/22/2017   Spinal cord compression (HCC) 02/06/2015   Type 2 diabetes mellitus without complication, without long-term current use of insulin (HCC) 04/10/2016   Vitamin D deficiency 04/10/2016   Past Surgical History:  Procedure Laterality Date   CARPAL  TUNNEL RELEASE Bilateral    CERVICAL SPINE SURGERY     HEMORRHOID SURGERY     LUMBAR SPINE SURGERY     scraped nerves   PERIPHERAL NEUROPLASTY OF FINGER / HAND / ARM Bilateral    transposition median nerve at carpal tunnel   TONSILLECTOMY      Allergies  Allergies   Allergen Reactions   Bee Venom Anaphylaxis   Atorvastatin Calcium     Other reaction(s): Other Muscle aches   Ezetimibe Other (See Comments)    Other Reaction(s): Myalgia, Myalgias  Other Reaction(s): Myalgias (intolerance), Other (See Comments)   Dapagliflozin     Other reaction(s): GI Upset (intolerance)   Metformin     Other reaction(s): Myalgias (intolerance) Tried on and off with same sx Other reaction(s): GI Upset (intolerance) Tried on and off with same sx   Pravastatin Other (See Comments)   Rosuvastatin Other (See Comments)   Atorvastatin Other (See Comments)    muscle aches; myalgia   Gabapentin Other (See Comments)    confusion    Home Medications    Prior to Admission medications   Medication Sig Start Date End Date Taking? Authorizing Provider  amoxicillin (AMOXIL) 500 MG capsule Take 4 capsules (2,000 mg total) by mouth daily as needed. Take two hours prior to your dental work. 09/23/19   Baldo Daub, MD  Ascorbic Acid (VITAMIN C) 1000 MG tablet Take 2,000 mg by mouth daily.    [provider]  aspirin EC 81 MG tablet Take 1 tablet (81 mg total) by mouth daily. 09/09/17   Baldo Daub, MD  carboxymethylcellulose 1 % ophthalmic solution Apply to eye.    [provider]  Dulaglutide (TRULICITY) 4.5 MG/0.5ML SOPN Inject 4.5 mg into the skin every 7 (seven) days. 05/27/22   [provider]  Evolocumab (REPATHA SURECLICK) 140 MG/ML SOAJ Inject 1 pen into the skin every 14 (fourteen) days. 06/15/19   Baldo Daub, MD  fluticasone (FLONASE) 50 MCG/ACT nasal spray 1 spray by Both Nostrils route daily as needed for Rhinitis.    [provider]  furosemide (LASIX) 20 MG tablet Take 1 tablet (20 mg total) by mouth daily. 06/06/22   Baldo Daub, MD  lisinopril-hydrochlorothiazide (PRINZIDE,ZESTORETIC) 20-12.5 MG tablet Take 0.5 tablets by mouth daily.    [provider]  nitroGLYCERIN (NITROSTAT) 0.4 MG SL tablet Place 1  tablet (0.4 mg total) under the tongue every 5 (five) minutes as needed for chest pain. 05/28/22   Baldo Daub, MD    Physical Exam    Vital Signs:  Keywan Reau does not have vital signs available for review today. Patient's blood pressure was 124/82  Given telephonic nature of communication, physical exam is limited. AAOx3. NAD. Normal affect.  Speech and respirations are unlabored.  Accessory Clinical Findings    None  Assessment & Plan    1.  Preoperative Cardiovascular Risk Assessment: -Patient's RCRI score is 0.9%  The patient affirms he has been doing well without any new cardiac symptoms. They are able to achieve 4 METS without cardiac limitations. Therefore, based on ACC/AHA guidelines, the patient would be at acceptable risk for the planned procedure without further cardiovascular testing. The patient was advised that if he develops new symptoms prior to surgery to contact our office to arrange for a follow-up visit, and he verbalized understanding.   The patient was advised that if he develops new symptoms prior to surgery to contact our office to arrange for a  follow-up visit, and he verbalized understanding.  Patient can hold aspirin 7 days prior to procedure and should restart postprocedure when surgically safe.  A copy of this note will be routed to requesting surgeon.  Time:   Today, I have spent 7 minutes with the patient with telehealth technology discussing medical history, symptoms, and management plan.     Napoleon Form, Leodis Rains, NP  10/16/2022, 7:09 AM

## 2022-12-02 DIAGNOSIS — T148XXA Other injury of unspecified body region, initial encounter: Secondary | ICD-10-CM | POA: Insufficient documentation

## 2023-07-09 DIAGNOSIS — J019 Acute sinusitis, unspecified: Secondary | ICD-10-CM | POA: Insufficient documentation

## 2023-08-20 DIAGNOSIS — L0293 Carbuncle, unspecified: Secondary | ICD-10-CM | POA: Insufficient documentation

## 2023-09-03 ENCOUNTER — Ambulatory Visit: Admitting: Cardiology

## 2023-10-05 ENCOUNTER — Other Ambulatory Visit: Payer: Self-pay | Admitting: Cardiology

## 2023-10-14 NOTE — ED Provider Notes (Signed)
 Chief Complaint  Patient presents with  . Dizziness    Pt BIBA from home for dizziness that started this morning. Pt was not orthostatic for medics, denies any HA, vision changes, chest pain or SOB. Pt also c/o hyperglycemia, states at home monitor was reading HI BGL 242 in triage. Pt reports chronic back pain 7/10        HPI Zachary Andrews is a 68 year old male presenting with dizziness.  Patient states he woke up this morning feeling dizzy.  Describes dizziness as feeling lightheaded.  Wife states that he was looking white as a ghost so she called 911. On arrival here, patient is asymptomatic and says that his dizziness is resolved. Patient denies any headache numbness tingling weakness change in taste vision smell or speech.  Denies any chest pain or shortness of breath.  Says that he has been dealing with back pain off-and-on.  Severity is moderate        Patient History Medical History[1] Surgical History[2] Family History[3] Social History[4]    Review of Systems Review of Systems  Constitutional:  Negative for chills and fever.  Cardiovascular:  Negative for chest pain.  Gastrointestinal:  Negative for abdominal pain, diarrhea, nausea and vomiting.  Musculoskeletal:  Positive for back pain (chronic).  Skin:  Positive for pallor (now resolved).  Neurological:  Positive for dizziness. Negative for speech difficulty, weakness and numbness.      Physical Exam ED Triage Vitals  Temp 10/14/23 1134 97.9 F (36.6 C)  Heart Rate 10/14/23 1134 73  Resp 10/14/23 1134 20  BP 10/14/23 1134 106/67  MAP (mmHg) 10/14/23 1230 74  SpO2 10/14/23 1134 97 %  O2 Device 10/14/23 1134 None (Room air)  O2 Flow Rate (L/min) --   Weight 10/14/23 1134 (!) 139 kg (306 lb 14.1 oz)   Physical Exam Constitutional:      General: He is not in acute distress.    Appearance: Normal appearance. He is not toxic-appearing.  HENT:     Head: Normocephalic and atraumatic.      Right Ear: External ear normal.     Left Ear: External ear normal.     Nose: Nose normal.   Eyes:     Extraocular Movements: Extraocular movements intact.     Conjunctiva/sclera: Conjunctivae normal.     Pupils: Pupils are equal, round, and reactive to light.   Neck:     Trachea: Phonation normal.   Cardiovascular:     Rate and Rhythm: Normal rate and regular rhythm.  Pulmonary:     Effort: Pulmonary effort is normal.   Musculoskeletal:        General: No swelling or deformity.   Skin:    Coloration: Skin is not jaundiced or pale.   Neurological:     Mental Status: He is alert and oriented to person, place, and time.     Comments: Cranial nerves II through XII grossly intact bilaterally.  Muscle strength 5 out of 5 in upper and lower extremities bilaterally . Sensation intact to face and extremities, cerebellar testing normal including normal finger-nose-finger     Psychiatric:        Mood and Affect: Mood normal.        Speech: Speech normal.        Behavior: Behavior normal.        CHA2DS2-VASc Score: N/A  Glasgow Coma Scale Score: 15  Procedures                       ED Course & MDM ED Course as of 10/14/23 1437  Tue Oct 14, 2023  1228 Patient was reassessed.  Symptoms remain resolved. [BW]    ED Course User Index [BW] Morene Brasher, DO   Medical Decision Making This is a 68 year old male presenting with dizziness.  Patient here is neurologically intact in no acute distress and well-appearing. Differential includes orthostasis dehydration electrolyte derangement, arrhythmia.  Lower suspicion for posterior circulation CVA as the patient's symptoms are resolved at time of my assessment.  Labs are obtained here showing stable renal function hyperglycemia with a glucose of 241 remainder of labs without clinically relevant abnormality troponin negative BNP normal EKG without evidence of STEMI nor arrhythmia per my depend interpretation.  Patient was  given IV fluids and on reassessment continues to feel well.  Blood pressure remains normal.  He is ambulatory with a steady unassisted gait and requested discharge.  He has upcoming follow-up with his cardiologist.  I discussed return precautions with both patient and wife at the bedside including recurrence of dizziness, chest pain, numbness tingling weakness or if he has any other concerns all questions were answered  Problems Addressed: Dizziness: complicated acute illness or injury  Amount and/or Complexity of Data Reviewed Labs: ordered. ECG/medicine tests: ordered.      ED Disposition:  Discharge Final diagnoses:  Dizziness    ED Prescriptions   None           [1] Past Medical History: Diagnosis Date  . Anaphylactic reaction   . Atypical nevi   . Blood in stool   . Calculus of left ureter   . Central spinal stenosis   . Chronic back pain   . Colon polyp   . Constipation, acute   . Dehydration   . Diabetes mellitus    (CMD)   . Hearing loss   . Hydronephrosis of left kidney   . Hypertension   . Kidney stone   . Lumbar stenosis with neurogenic claudication   . Melanoma    (CMD)   . Microscopic hematuria   . OSA (obstructive sleep apnea) 12/24/2020   Split, AHI 21.7  . Osteoarthritis of shoulder   . SCC (squamous cell carcinoma)   . Sleep apnea   . Tinea   . Viral wart   [2] Past Surgical History: Procedure Laterality Date  . BACK SURGERY     Procedure: BACK SURGERY  . CAROTID ENDARTERECTOMY Right 2019   Procedure: CAROTID ENDARTERECTOMY  . CARPAL TUNNEL RELEASE Bilateral 2017   Procedure: CARPAL TUNNEL RELEASE  . CERVICAL FUSION N/A 2017   Procedure: CERVICAL FUSION  . EXTRACORPOREAL SHOCK WAVE LITHOTRIPSY Left 07/27/2020   Procedure: ESWL;  Surgeon: Deward Elsie Stalling, MD;  Location: [facility];  Service: Urology;  Laterality: Left;  . HEMORROIDECTOMY N/A 1980   Procedure: HEMORROIDECTOMY  . LUMBAR EPIDURAL INJECTION Right 04/19/2014    Procedure: LUMBAR TRANSFORAMINAL EPIDURAL INJECTION- R L5-S1 TFESI;  Surgeon: Louvella Lynwood Home, MD;  [phone];  Service: Ortho Physiatry;  Laterality: Right;  R L5-S1 TFESI  . LUMBAR SPINE SURGERY N/A 2016   Procedure: LUMBAR SPINE SURGERY; arthritis, spurs  . LUMBAR SPINE SURGERY N/A 2024  . SKIN BIOPSY     Procedure: SKIN BIOPSY  . TONSILLECTOMY Bilateral 1963   Procedure: TONSILLECTOMY  . TOOTH EXTRACTION     Procedure: TOOTH EXTRACTION  [  3] Family History Problem Relation Name Age of Onset  . Heart failure Mother    . Arthritis Mother    . Cancer Mother         bone  . Osteoporosis Mother    . Hypertension Mother    . Melanoma Mother    . Heart attack Father  33  . Stroke Father  4  . Hypertension Father    . Leukemia Brother    . Colon cancer Neg Hx    . Prostate cancer Neg Hx    . Other Neg Hx         PASCVD  . Rashes / Skin problems Neg Hx    . Psoriasis Neg Hx    . Eczema Neg Hx    . Multiple endocrine neoplasia Neg Hx    . Thyroid cancer Neg Hx    [4] Social History Tobacco Use  . Smoking status: Former    Current packs/day: 0.00    Average packs/day: 2.0 packs/day for 47.0 years (94.0 ttl pk-yrs)    Types: Cigarettes    Start date: 35    Quit date: 02/26/2012    Years since quitting: 11.6  . Smokeless tobacco: Never  . Tobacco comments:    Per 2024 LS form/chart (2)   Substance Use Topics  . Alcohol use: No  . Drug use: No    Comment: Drug use: Denies

## 2023-10-17 NOTE — Progress Notes (Signed)
 Cardiology Office Note:    Date:  10/22/2023   ID:  Zachary Andrews, DOB 1955-11-19, MRN [id number]  PCP:  Ofilia Lamar CROME, MD  Cardiologist:  Redell Leiter, MD    Referring MD: Ofilia Lamar CROME, MD    ASSESSMENT:    1. Nonrheumatic aortic valve stenosis   2. Coronary artery disease of native artery of native heart with stable angina pectoris (HCC)   3. Hypertensive heart disease without heart failure   4. CKD stage 3b, GFR 30-44 ml/min (HCC)   5. Mixed hyperlipidemia    PLAN:    In order of problems listed above:  My concern is he is developing symptomatic aortic stenosis we will recheck his echocardiogram in my office.  If severe will need to consider intervention. Stable CAD having no anginal discomfort no evidence of acute coronary syndrome the other day with his ED visit we will continue his medical therapy including aspirin  and he is nonstatin Repatha . Hypertension he will continue his diuretic and lisinopril. Stable CKD   Next appointment: 6 months   Medication Adjustments/Labs and Tests Ordered: Current medicines are reviewed at length with the patient today.  Concerns regarding medicines are outlined above.  Orders Placed This Encounter  Procedures   EKG 12-Lead   No orders of the defined types were placed in this encounter.    History of Present Illness:    Zachary Andrews is a 68 y.o. male with a hx of CAD mild aortic stenosis hypertensive heart disease without heart failure type 2 diabetes and stage III CKD and hyperlipidemia last seen here 05/28/2022.  His echocardiogram May 2023 showed peak and mean gradients 40 to 24 mmHg and mild enlargement ascending aorta 40 mm.  Recent labs included BNP level quite low at 14 high-sensitivity troponin below detection 6 CMP creatinine 1.38 GFR 56 cc/min hemoglobin 14.1 EKG showed sinus rhythm possible old anterior MI  Compliance with diet, lifestyle and medications: Yes  This is a complex history he clearly had a hypotensive  episode prompting ED visit but even with hypotension resolved he has what appears to be chronic vertigo and unsteady gait.  He also has aortic stenosis he needs to get an echocardiogram performed and he is having exertional shortness of breath but no syncope or chest pain Now his blood pressure is running in the range of 130/80. Past Medical History:  Diagnosis Date   Abdominal aortic atherosclerosis (HCC) 05/19/2020   05/19/2020: on CT     Acute non-recurrent sinusitis 07/09/2023   07/09/2023     Acute renal insufficiency 09/16/2019   Aortic stenosis, moderate 09/09/2017   Formatting of this note might be different from the original.  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   Carbuncle 08/20/2023   08/20/2023: right axilla     Carotid stenosis, right 09/09/2017   Carpal tunnel syndrome, bilateral 06/01/2015   Central spinal stenosis 12/21/2016   Chronic bilateral low back pain with left-sided sciatica 07/11/2015   Coronary artery disease involving native coronary artery of native heart 12/21/2016   Deviated nasal septum 01/31/2022   01/31/2022, rightward     Elevated LFTs 08/22/2017   ICD-10 cut over   Frequent falls 10/15/2016   H/O spinal stenosis 08/22/2017   Hepatic steatosis 05/19/2020   05/19/2020: on CT     History of cigarette smoking 05/27/2022   Last 2014, 44 pack years     History  of pulmonary embolism 09/21/2019   Hypertension 02/06/2015   Hypertensive heart disease 02/06/2015   Moderate LVH     Hypotension 09/16/2019   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   02/07/2022:     Ingrown nail 09/29/2021   Intervertebral disc disorder with radiculopathy of lumbosacral region 10/04/2014   Ischial bursitis 08/03/2019   Lumbar disc disease 08/22/2017   Lumbar radiculopathy 12/25/2015   Lumbar spondylosis 04/29/2016   Lumbar stenosis with neurogenic claudication 12/21/2016    Microalbuminuria 08/22/2017   Mixed hyperlipidemia 03/13/2015   Refusing statin   Nephrolithiasis 12/21/2016   Neuropathy 08/19/2017   From cervical and lumbar spinal stenosis  And carpal tunnel   Non-recurrent unilateral inguinal hernia without obstruction or gangrene 12/23/2016   2018   Numbness 02/06/2015   Obesity 08/22/2017   Obstructive sleep apnea on CPAP 04/10/2016   Managed PULM   Occlusion of right internal carotid artery 12/21/2016   >70%   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   Osteoarthritis of shoulder 12/21/2016   Pain around toenail, right foot 09/29/2021   Pain in left knee 07/08/2017   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 2 diabetes mellitus (HCC) 09/13/2021   Polyp of colon 12/21/2016   Primary osteoarthritis of left hip 11/19/2016   Pulmonary embolism (HCC) 09/21/2019   Rib pain on left side 05/19/2020   05/19/2020:     Risk for falls 04/16/2021   04/16/2021 : high risk, is falling, will not use walker; has poor core   strength and foot neuropathy; declined PT, etc; risk discussed     Sensorineural hearing loss (SNHL) of both ears 04/20/2018   Sexual dysfunction 08/22/2017   Spinal cord compression (HCC) 02/06/2015   Stage 2 chronic kidney disease 09/10/2017   Stage 3a chronic kidney disease (HCC) 10/23/2021   10/23/2021: 56, stable, HTN/DM/VASC     Surgical wound present 12/02/2022   12/02/2022 : LSS     Suspected severe acute respiratory syndrome coronavirus 2 (SARS-CoV-2) infection 09/16/2019   Symptoms involving cardiovascular system 01/12/2019   Tinea unguium 12/21/2016   Tinnitus of both ears 04/20/2018   Type 2 diabetes mellitus without complication, without long-term current use of insulin (HCC) 04/10/2016   Vitamin D deficiency 04/10/2016   Wellness examination 05/09/2022    Current Medications: Current Meds  Medication Sig   lisinopril (ZESTRIL) 20 MG tablet Take 20 mg by mouth daily.   pregabalin (LYRICA) 50 MG capsule Take 50 mg  by mouth at bedtime.   traMADol (ULTRAM) 50 MG tablet Take 50 mg by mouth every 6 (six) hours as needed for moderate pain (pain score 4-6) or severe pain (pain score 7-10).      EKGs/Labs/Other Studies Reviewed:    The following studies were reviewed today:  Cardiac Studies & Procedures   ______________________________________________________________________________________________   STRESS TESTS  MYOCARDIAL PERFUSION IMAGING 10/13/2019  Interpretation Summary  The left ventricular ejection fraction is normal (55-65%).  Nuclear stress EF: 63%.  There was no ST segment deviation noted during stress.  This is a low risk study.   ECHOCARDIOGRAM  ECHOCARDIOGRAM COMPLETE 07/04/2021  Narrative ECHOCARDIOGRAM REPORT    Patient Name:   Zachary Andrews Date of Exam: 07/04/2021 Medical Rec #:  [id number]  Height:       75.0 in Accession #:    [id number] Weight:       305.0 lb Date of Birth:  11-21-1955 BSA:          2.625 m Patient Age:    68 years  BP:           118/78 mmHg Patient Gender: M          HR:           67 bpm. Exam Location:  [facility]  Procedure: 2D Echo, Cardiac Doppler, Color Doppler and Strain Analysis  Indications:    Aortic stenosis I35.0  History:        Patient has prior history of Echocardiogram examinations, most recent 07/07/2019. CAD, Pulmonary embolism, Aortic Valve Disease, Signs/Symptoms:Hypertensive Heart Disease; Risk Factors:Diabetes and Dyslipidemia.  Sonographer:    Lynwood Silvas RDCS Referring Phys: [id number] Tamantha Saline J Karandeep Resende  IMPRESSIONS   1. Left ventricular ejection fraction, by estimation, is 60 to 65%. The left ventricle has normal function. The left ventricle has no regional wall motion abnormalities. There is moderate left ventricular hypertrophy. Left ventricular diastolic parameters are consistent with Grade I diastolic dysfunction (impaired relaxation). 2. Right ventricular systolic function is normal. The right ventricular size is normal.  There is normal pulmonary artery systolic pressure. 3. Left atrial size was mildly dilated. 4. The mitral valve is normal in structure. No evidence of mitral valve regurgitation. No evidence of mitral stenosis. 5. The aortic valve was not well visualized. Aortic valve regurgitation is not visualized. Moderate aortic valve stenosis. 6. Aneurysm of the ascending aorta, measuring 40 mm. 7. The inferior vena cava is normal in size with greater than 50% respiratory variability, suggesting right atrial pressure of 3 mmHg.  FINDINGS Left Ventricle: Left ventricular ejection fraction, by estimation, is 60 to 65%. The left ventricle has normal function. The left ventricle has no regional wall motion abnormalities. The left ventricular internal cavity size was normal in size. There is moderate left ventricular hypertrophy. Left ventricular diastolic parameters are consistent with Grade I diastolic dysfunction (impaired relaxation).  Right Ventricle: The right ventricular size is normal. No increase in right ventricular wall thickness. Right ventricular systolic function is normal. There is normal pulmonary artery systolic pressure. The tricuspid regurgitant velocity is 2.08 m/s, and with an assumed right atrial pressure of 3 mmHg, the estimated right ventricular systolic pressure is 20.3 mmHg.  Left Atrium: Left atrial size was mildly dilated.  Right Atrium: Right atrial size was normal in size.  Pericardium: There is no evidence of pericardial effusion.  Mitral Valve: The mitral valve is normal in structure. No evidence of mitral valve regurgitation. No evidence of mitral valve stenosis.  Tricuspid Valve: The tricuspid valve is normal in structure. Tricuspid valve regurgitation is not demonstrated. No evidence of tricuspid stenosis.  Aortic Valve: The aortic valve was not well visualized. Aortic valve regurgitation is not visualized. Moderate aortic stenosis is present. Aortic valve mean gradient  measures 24.0 mmHg. Aortic valve peak gradient measures 42.2 mmHg. Aortic valve area, by VTI measures 1.11 cm.  Pulmonic Valve: The pulmonic valve was normal in structure. Pulmonic valve regurgitation is not visualized. No evidence of pulmonic stenosis.  Aorta: The aortic root is normal in size and structure. There is an aneurysm involving the ascending aorta measuring 40 mm.  Venous: The inferior vena cava is normal in size with greater than 50% respiratory variability, suggesting right atrial pressure of 3 mmHg.  IAS/Shunts: No atrial level shunt detected by color flow Doppler.   LEFT VENTRICLE PLAX 2D LVIDd:         5.20 cm   Diastology LVIDs:         3.40 cm   LV e' medial:    5.98 cm/s LV PW:  1.50 cm   LV E/e' medial:  12.5 LV IVS:        1.50 cm   LV e' lateral:   7.49 cm/s LVOT diam:     2.00 cm   LV E/e' lateral: 10.0 LV SV:         75 LV SV Index:   28 LVOT Area:     3.14 cm   RIGHT VENTRICLE             IVC RV S prime:     16.60 cm/s  IVC diam: 1.40 cm TAPSE (M-mode): 2.8 cm  LEFT ATRIUM             Index        RIGHT ATRIUM           Index LA diam:        4.10 cm 1.56 cm/m   RA Area:     21.60 cm LA Vol (A2C):   62.8 ml 23.92 ml/m  RA Volume:   66.80 ml  25.44 ml/m LA Vol (A4C):   62.6 ml 23.84 ml/m LA Biplane Vol: 65.7 ml 25.02 ml/m AORTIC VALVE AV Area (Vmax):    0.99 cm AV Area (Vmean):   0.98 cm AV Area (VTI):     1.11 cm AV Vmax:           325.00 cm/s AV Vmean:          226.000 cm/s AV VTI:            0.675 m AV Peak Grad:      42.2 mmHg AV Mean Grad:      24.0 mmHg LVOT Vmax:         102.00 cm/s LVOT Vmean:        70.800 cm/s LVOT VTI:          0.238 m LVOT/AV VTI ratio: 0.35  AORTA Ao Root diam: 3.70 cm Ao Asc diam:  4.00 cm Ao Desc diam: 2.10 cm  MITRAL VALVE               TRICUSPID VALVE MV Area (PHT): 4.04 cm    TR Peak grad:   17.3 mmHg MV Decel Time: 188 msec    TR Vmax:        208.00 cm/s MV E velocity: 74.60 cm/s MV  A velocity: 84.00 cm/s  SHUNTS MV E/A ratio:  0.89        Systemic VTI:  0.24 m Systemic Diam: 2.00 cm  Jennifer Crape MD Electronically signed by Jennifer Crape MD Signature Date/Time: 07/04/2021/4:21:08 PM    Final          ______________________________________________________________________________________________      EKG Interpretation Date/Time:  Wednesday October 22 2023 08:39:54 EDT Ventricular Rate:  64 PR Interval:  202 QRS Duration:  78 QT Interval:  372 QTC Calculation: 383 R Axis:   64  Text Interpretation: Normal sinus rhythm Normal ECG No previous ECGs available Confirmed by Monetta Rogue ([id number]) on 10/22/2023 8:43:18 AM   Recent Labs: No results found for requested labs within last 365 days.  Recent Lipid Panel    Component Value Date/Time   CHOL 129 07/04/2021 1525   TRIG 123 07/04/2021 1525   HDL 33 (L) 07/04/2021 1525   CHOLHDL 3.9 07/04/2021 1525   LDLCALC 74 07/04/2021 1525    Physical Exam:    VS:  BP 132/82   Pulse 64   Ht 6' 3 (1.905 m)   [place] ROLLEN)  307 lb 12.8 oz (139.6 kg)   SpO2 96%   BMI 38.47 kg/m     Wt Readings from Last 3 Encounters:  10/22/23 (!) 307 lb 12.8 oz (139.6 kg)  05/28/22 (!) 304 lb (137.9 kg)  06/25/21 (!) 305 lb (138.3 kg)     GEN: Obese BMI greater than 38 well nourished, well developed in no acute distress HEENT: Normal NECK: No JVD; No carotid bruits LYMPHATICS: No lymphadenopathy CARDIAC: 2 of 6 AAS murmur seems to encompass S2 but does not radiate to the carotids RRR, no murmurs, rubs, gallops RESPIRATORY:  Clear to auscultation without rales, wheezing or rhonchi  ABDOMEN: Soft, non-tender, non-distended MUSCULOSKELETAL:  No edema; No deformity  SKIN: Warm and dry NEUROLOGIC:  Alert and oriented x 3 PSYCHIATRIC:  Normal affect    Signed, Redell Leiter, MD  10/22/2023 8:52 AM    [facility]

## 2023-10-22 ENCOUNTER — Encounter: Payer: Self-pay | Admitting: Cardiology

## 2023-10-22 ENCOUNTER — Ambulatory Visit: Attending: Cardiology | Admitting: Cardiology

## 2023-10-22 VITALS — BP 132/82 | HR 64 | Ht 75.0 in | Wt 307.8 lb

## 2023-10-22 DIAGNOSIS — I350 Nonrheumatic aortic (valve) stenosis: Secondary | ICD-10-CM

## 2023-10-22 DIAGNOSIS — I119 Hypertensive heart disease without heart failure: Secondary | ICD-10-CM | POA: Diagnosis not present

## 2023-10-22 DIAGNOSIS — E782 Mixed hyperlipidemia: Secondary | ICD-10-CM

## 2023-10-22 DIAGNOSIS — N1832 Chronic kidney disease, stage 3b: Secondary | ICD-10-CM

## 2023-10-22 DIAGNOSIS — I25118 Atherosclerotic heart disease of native coronary artery with other forms of angina pectoris: Secondary | ICD-10-CM

## 2023-10-22 MED ORDER — NITROGLYCERIN 0.4 MG SL SUBL: 0.4000 mg | SUBLINGUAL_TABLET | SUBLINGUAL | 1 refills | Status: DC | PRN

## 2023-10-22 MED ORDER — AMOXICILLIN 500 MG PO CAPS: 2000.0000 mg | ORAL_CAPSULE | Freq: Every day | ORAL | 1 refills | Status: DC | PRN

## 2023-10-22 NOTE — Patient Instructions (Signed)
 Medication Instructions:  Your physician recommends that you continue on your current medications as directed. Please refer to the Current Medication list given to you today.  *If you need a refill on your cardiac medications before your next appointment, please call your pharmacy*  Lab Work: None If you have labs (blood work) drawn today and your tests are completely normal, you will receive your results only by: MyChart Message (if you have MyChart) OR A paper copy in the mail If you have any lab test that is abnormal or we need to change your treatment, we will call you to review the results.  Testing/Procedures: Your physician has requested that you have an echocardiogram. Echocardiography is a painless test that uses sound waves to create images of your heart. It provides your doctor with information about the size and shape of your heart and how well your heart's chambers and valves are working. This procedure takes approximately one hour. There are no restrictions for this procedure. Please do NOT wear cologne, perfume, aftershave, or lotions (deodorant is allowed). Please arrive 15 minutes prior to your appointment time.  Please note: We ask at that you not bring children with you during ultrasound (echo/ vascular) testing. Due to room size and safety concerns, children are not allowed in the ultrasound rooms during exams. Our front office staff cannot provide observation of children in our lobby area while testing is being conducted. An adult accompanying a patient to their appointment will only be allowed in the ultrasound room at the discretion of the ultrasound technician under special circumstances. We apologize for any inconvenience.   Follow-Up: At [facility], you and your health needs are our priority.  As part of our continuing mission to provide you with exceptional heart care, our providers are all part of one team.  This team includes your primary Cardiologist  (physician) and Advanced Practice Providers or APPs (Physician Assistants and Nurse Practitioners) who all work together to provide you with the care you need, when you need it.  Your next appointment:   6 month(s)  Provider:   Redell Leiter, MD    We recommend signing up for the patient portal called MyChart.  Sign up information is provided on this After Visit Summary.  MyChart is used to connect with patients for Virtual Visits (Telemedicine).  Patients are able to view lab/test results, encounter notes, upcoming appointments, etc.  Non-urgent messages can be sent to your provider as well.   To learn more about what you can do with MyChart, go to [email or web address].   Other Instructions None

## 2023-10-23 ENCOUNTER — Ambulatory Visit: Attending: Cardiology

## 2023-10-23 DIAGNOSIS — I25118 Atherosclerotic heart disease of native coronary artery with other forms of angina pectoris: Secondary | ICD-10-CM

## 2023-10-23 DIAGNOSIS — I119 Hypertensive heart disease without heart failure: Secondary | ICD-10-CM | POA: Diagnosis not present

## 2023-10-23 DIAGNOSIS — E782 Mixed hyperlipidemia: Secondary | ICD-10-CM

## 2023-10-23 DIAGNOSIS — I350 Nonrheumatic aortic (valve) stenosis: Secondary | ICD-10-CM | POA: Diagnosis not present

## 2023-10-23 DIAGNOSIS — N1832 Chronic kidney disease, stage 3b: Secondary | ICD-10-CM

## 2023-10-23 LAB — ECHOCARDIOGRAM COMPLETE
AR max vel: 1.07 cm2
AV Area VTI: 1.24 cm2
AV Area mean vel: 1.08 cm2
AV Mean grad: 31 mmHg
AV Peak grad: 63.4 mmHg
Ao pk vel: 3.98 m/s
Area-P 1/2: 3.64 cm2
S' Lateral: 3 cm

## 2023-10-24 ENCOUNTER — Telehealth: Payer: Self-pay | Admitting: Cardiology

## 2023-10-24 ENCOUNTER — Ambulatory Visit: Payer: Self-pay | Admitting: Cardiology

## 2023-10-24 NOTE — Telephone Encounter (Signed)
  Patient is wants to pick up a copy of his echo results from the office since he does not have MyChart. Please advise.

## 2023-10-29 NOTE — Telephone Encounter (Signed)
 Wife Calla) called to follow-up on patient's test results.

## 2023-10-29 NOTE — Telephone Encounter (Signed)
 Left message for the patient to call back.

## 2023-10-30 NOTE — Telephone Encounter (Signed)
 Heron is returning call on patient's behalf.

## 2023-11-03 NOTE — Telephone Encounter (Signed)
 Patient returning call.

## 2023-11-03 NOTE — Telephone Encounter (Signed)
Zachary Andrews is returning call

## 2023-11-03 NOTE — Telephone Encounter (Signed)
 Left message for the patient to call back.

## 2023-11-05 NOTE — Telephone Encounter (Signed)
 Left vm to return call.

## 2023-11-06 ENCOUNTER — Other Ambulatory Visit: Payer: Self-pay

## 2023-11-06 DIAGNOSIS — I350 Nonrheumatic aortic (valve) stenosis: Secondary | ICD-10-CM

## 2023-11-07 ENCOUNTER — Ambulatory Visit (INDEPENDENT_AMBULATORY_CARE_PROVIDER_SITE_OTHER)
Admission: RE | Admit: 2023-11-07 | Discharge: 2023-11-07 | Disposition: A | Discharge status: Home / Self Care | Payer: Self-pay | Source: Ambulatory Visit | Attending: Cardiology | Admitting: Cardiology

## 2023-11-07 ENCOUNTER — Other Ambulatory Visit (HOSPITAL_BASED_OUTPATIENT_CLINIC_OR_DEPARTMENT_OTHER)

## 2023-11-07 DIAGNOSIS — I350 Nonrheumatic aortic (valve) stenosis: Secondary | ICD-10-CM

## 2023-11-10 NOTE — Telephone Encounter (Signed)
 Wife Zachary Andrews) called to follow up on CT CARDIAC SCORING test results.

## 2023-11-11 NOTE — Telephone Encounter (Signed)
 Heron states that she had talked with Dr. Monetta and that he will be seeing them on Friday to discuss the results.

## 2023-11-13 NOTE — H&P (View-Only) (Signed)
 Cardiology Office Note:    Date:  11/14/2023   ID:  Zachary Andrews, DOB 1955/12/30, MRN [id number]  PCP:  Ofilia Lamar CROME, MD  Cardiologist:  Redell Leiter, MD    Referring MD: Ofilia Lamar CROME, MD    ASSESSMENT:    1. Nonrheumatic aortic valve stenosis   2. Coronary artery disease of native artery of native heart with stable angina pectoris (HCC)   3. Hypertensive heart disease without heart failure   4. CKD stage 3b, GFR 30-44 ml/min (HCC)   5. Mixed hyperlipidemia   6. Paroxysmal atrial fibrillation (HCC)   7. Chronic anticoagulation    PLAN:    In order of problems listed above:  [name] has symptomatic aortic stenosis severe Advised to undergo left and right heart catheterization for consideration of valve intervention hopefully TAVR Stable CAD he will undergo left heart catheterization unless he had severe obstructive CAD it would not change plans for valve intervention Stable hypertension no recurrent episode of lightheadedness reviewed his home blood pressures which run mostly in the 120-140 systolic range Stable kidney disease improved on his last labs with a GFR of 56 cc/min 1 month ago creatinine 1.38 Continue his current lipid-lowering therapy Prior to leaving the office we did an EKG he is in rate controlled atrial fibrillation and new finding. Stop aspirin  and initiate anticoagulation with Eliquis  1 week ZIO monitor to assess burden of atrial fibrillation and rate control   Next appointment: 6 months   Medication Adjustments/Labs and Tests Ordered: Current medicines are reviewed at length with the patient today.  Concerns regarding medicines are outlined above.  Orders Placed This Encounter  Procedures   CBC   Basic Metabolic Panel (BMET)   EKG 12-Lead   No orders of the defined types were placed in this encounter.    History of Present Illness:    Zachary Andrews is a 68 y.o. male with a hx of CAD previous mild aortic stenosis hypertensive heart disease  without heart failure type 2 diabetes stage III CKD and hyperlipidemia last seen 10/22/2023 after an episode of documented hypotension with new exertional shortness of breath.  Compliance with diet, lifestyle and medications: Yes  He is seen along with his wife today. He has had no further episode of near syncope and no worsening of the shortness of breath No edema orthopnea chest pain or mucosal bleeding he has had no fever or chill I reviewed the results of his echocardiogram with him clinically I think he had more severe than moderate stenosis symptomatic and confirmed on the cardiac CTA with a valve calcium score greater 2000 and a man I advised him to undergo further evaluation potentially for TAVR risk benefits options detailed informed consent obtained.  His echocardiogram after that visit 10/23/2023 showed aortic stenosis had progressed with peak and mean gradients of 63/31 mmHg valve area of 1.2 sonometer squared but his SVI was diminished.  He subsequent underwent CT calcium score the aortic valve reported 2725. Past Medical History:  Diagnosis Date   Abdominal aortic atherosclerosis (HCC) 05/19/2020   05/19/2020: on CT     Acute non-recurrent sinusitis 07/09/2023   07/09/2023     Acute renal insufficiency 09/16/2019   Aortic stenosis, moderate 09/09/2017   Formatting of this note might be different from the original.  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   Carbuncle 08/20/2023   08/20/2023: right axilla  Carotid stenosis, right 09/09/2017   Carpal tunnel syndrome, bilateral 06/01/2015   Central spinal stenosis 12/21/2016   Chronic bilateral low back pain with left-sided sciatica 07/11/2015   Coronary artery disease involving native coronary artery of native heart 12/21/2016   Deviated nasal septum 01/31/2022   01/31/2022, rightward     Elevated LFTs 08/22/2017   ICD-10 cut over    Frequent falls 10/15/2016   H/O spinal stenosis 08/22/2017   Hepatic steatosis 05/19/2020   05/19/2020: on CT     History of cigarette smoking 05/27/2022   Last 2014, 44 pack years     History of pulmonary embolism 09/21/2019   Hypertension 02/06/2015   Hypertensive heart disease 02/06/2015   Moderate LVH     Hypotension 09/16/2019   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   02/07/2022:     Ingrown nail 09/29/2021   Intervertebral disc disorder with radiculopathy of lumbosacral region 10/04/2014   Ischial bursitis 08/03/2019   Lumbar disc disease 08/22/2017   Lumbar radiculopathy 12/25/2015   Lumbar spondylosis 04/29/2016   Lumbar stenosis with neurogenic claudication 12/21/2016   Microalbuminuria 08/22/2017   Mixed hyperlipidemia 03/13/2015   Refusing statin   Nephrolithiasis 12/21/2016   Neuropathy 08/19/2017   From cervical and lumbar spinal stenosis  And carpal tunnel   Non-recurrent unilateral inguinal hernia without obstruction or gangrene 12/23/2016   2018   Numbness 02/06/2015   Obesity 08/22/2017   Obstructive sleep apnea on CPAP 04/10/2016   Managed PULM   Occlusion of right internal carotid artery 12/21/2016   >70%   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   Osteoarthritis of shoulder 12/21/2016   Pain around toenail, right foot 09/29/2021   Pain in left knee 07/08/2017   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 2 diabetes mellitus (HCC) 09/13/2021   Polyp of colon 12/21/2016   Primary osteoarthritis of left hip 11/19/2016   Pulmonary embolism (HCC) 09/21/2019   Rib pain on left side 05/19/2020   05/19/2020:     Risk for falls 04/16/2021   04/16/2021 : high risk, is falling, will not use walker; has poor core   strength and foot neuropathy; declined PT, etc; risk discussed     Sensorineural hearing loss (SNHL) of both ears 04/20/2018   Sexual dysfunction 08/22/2017   Spinal cord compression (HCC) 02/06/2015   Stage 2 chronic kidney disease 09/10/2017   Stage  3a chronic kidney disease (HCC) 10/23/2021   10/23/2021: 56, stable, HTN/DM/VASC     Surgical wound present 12/02/2022   12/02/2022 : LSS     Suspected severe acute respiratory syndrome coronavirus 2 (SARS-CoV-2) infection 09/16/2019   Symptoms involving cardiovascular system 01/12/2019   Tinea unguium 12/21/2016   Tinnitus of both ears 04/20/2018   Type 2 diabetes mellitus without complication, without long-term current use of insulin (HCC) 04/10/2016   Vitamin D deficiency 04/10/2016   Wellness examination 05/09/2022    Current Medications: Current Meds  Medication Sig   amoxicillin  (AMOXIL ) 500 MG capsule Take 4 capsules (2,000 mg total) by mouth daily as needed. Take two hours prior to your dental work.   Ascorbic Acid (VITAMIN C) 1000 MG tablet Take 2,000 mg by mouth daily.   carboxymethylcellulose 1 % ophthalmic solution Apply to eye.   Dulaglutide (TRULICITY) 4.5 MG/0.5ML SOPN Inject 4.5 mg into the skin every 7 (seven) days.   Evolocumab  (REPATHA  SURECLICK) 140 MG/ML SOAJ Inject 1 pen into the skin every 14 (fourteen) days.   fluticasone (FLONASE) 50 MCG/ACT nasal spray 1 spray by Both  Nostrils route daily as needed for Rhinitis.   furosemide  (LASIX ) 20 MG tablet TAKE 1 TABLET BY MOUTH EVERY DAY   lisinopril (ZESTRIL) 20 MG tablet Take 20 mg by mouth daily.   nitroGLYCERIN  (NITROSTAT ) 0.4 MG SL tablet Place 1 tablet (0.4 mg total) under the tongue every 5 (five) minutes as needed for chest pain.   pregabalin (LYRICA) 50 MG capsule Take 50 mg by mouth at bedtime.   traMADol (ULTRAM) 50 MG tablet Take 50 mg by mouth every 6 (six) hours as needed for moderate pain (pain score 4-6) or severe pain (pain score 7-10).   [DISCONTINUED] aspirin  EC 81 MG tablet Take 1 tablet (81 mg total) by mouth daily.      EKGs/Labs/Other Studies Reviewed:    The following studies were reviewed today:  Cardiac Studies & Procedures    ______________________________________________________________________________________________   STRESS TESTS  MYOCARDIAL PERFUSION IMAGING 10/13/2019  Interpretation Summary  The left ventricular ejection fraction is normal (55-65%).  Nuclear stress EF: 63%.  There was no ST segment deviation noted during stress.  This is a low risk study.   ECHOCARDIOGRAM  ECHOCARDIOGRAM COMPLETE 10/23/2023  Narrative ECHOCARDIOGRAM REPORT    Patient Name:   Zachary Andrews Date of Exam: 10/23/2023 Medical Rec #:  [id number]  Height:       75.0 in Accession #:    [id number] Weight:       307.8 lb Date of Birth:  [birth year] BSA:          2.636 m Patient Age:    67 years   BP:           132/82 mmHg Patient Gender: M          HR:           63 bpm. Exam Location:  [facility]  Procedure: 2D Echo, Cardiac Doppler, Color Doppler and Strain Analysis (Both Spectral and Color Flow Doppler were utilized during procedure).  Indications:    Coronary artery disease of native artery of native heart with stable angina pectoris (HCC) [I25.118]. Nonrheumatic aortic valve stenosis [I35.0]. Hypertensive heart disease without heart failure [I11.9]. CKD stage 3b, GFR 30-44 ml/min (HCC) [N18.32]. Mixed hyperlipidemia [E78.2] Aortic stenosis I35.0  History:        Patient has prior history of Echocardiogram examinations, most recent 07/04/2021. CAD, Aortic Valve Disease; Risk Factors:Hypertension and Dyslipidemia.  Sonographer:    Charlie Jointer RDCS Referring Phys: [id number] Rayanne Padmanabhan J Tommy Minichiello  IMPRESSIONS   1. Left ventricular ejection fraction, by estimation, is 60 to 65%. The left ventricle has normal function. The left ventricle demonstrates global hypokinesis. There is moderate left ventricular hypertrophy. Left ventricular diastolic parameters are indeterminate. The average left ventricular global longitudinal strain is -14.7 %. The global longitudinal strain is abnormal. 2. Right ventricular systolic  function is normal. The right ventricular size is normal. 3. The mitral valve is normal in structure. No evidence of mitral valve regurgitation. No evidence of mitral stenosis. 4. The aortic valve was not well visualized. Aortic valve regurgitation is not visualized. Moderate aortic valve stenosis. 5. Aneurysm of the ascending aorta, measuring 40 mm. 6. The inferior vena cava is normal in size with greater than 50% respiratory variability, suggesting right atrial pressure of 3 mmHg.  FINDINGS Left Ventricle: Left ventricular ejection fraction, by estimation, is 60 to 65%. The left ventricle has normal function. The left ventricle demonstrates global hypokinesis. The average left ventricular global longitudinal strain is -14.7 %. Strain was performed and the global longitudinal  strain is abnormal. The left ventricular internal cavity size was normal in size. There is moderate left ventricular hypertrophy. Left ventricular diastolic parameters are indeterminate.  Right Ventricle: The right ventricular size is normal. No increase in right ventricular wall thickness. Right ventricular systolic function is normal.  Left Atrium: Left atrial size was normal in size.  Right Atrium: Right atrial size was normal in size.  Pericardium: There is no evidence of pericardial effusion.  Mitral Valve: The mitral valve is normal in structure. No evidence of mitral valve regurgitation. No evidence of mitral valve stenosis.  Tricuspid Valve: The tricuspid valve is normal in structure. Tricuspid valve regurgitation is not demonstrated. No evidence of tricuspid stenosis.  Aortic Valve: The aortic valve was not well visualized. Aortic valve regurgitation is not visualized. Moderate aortic stenosis is present. Aortic valve mean gradient measures 31.0 mmHg. Aortic valve peak gradient measures 63.4 mmHg. Aortic valve area, by VTI measures 1.24 cm.  Pulmonic Valve: The pulmonic valve was normal in structure. Pulmonic  valve regurgitation is not visualized. No evidence of pulmonic stenosis.  Aorta: The aortic root is normal in size and structure. There is an aneurysm involving the ascending aorta measuring 40 mm.  Venous: The inferior vena cava is normal in size with greater than 50% respiratory variability, suggesting right atrial pressure of 3 mmHg.  IAS/Shunts: No atrial level shunt detected by color flow Doppler.   LEFT VENTRICLE PLAX 2D LVIDd:         5.00 cm   Diastology LVIDs:         3.00 cm   LV e' medial:    6.06 cm/s LV PW:         1.50 cm   LV E/e' medial:  13.8 LV IVS:        1.50 cm   LV e' lateral:   5.87 cm/s LVOT diam:     2.00 cm   LV E/e' lateral: 14.2 LV SV:         89 LV SV Index:   34        2D Longitudinal Strain LVOT Area:     3.14 cm  2D Strain GLS Avg:     -14.7 %   RIGHT VENTRICLE             IVC RV Basal diam:  4.10 cm     IVC diam: 2.80 cm RV Mid diam:    2.70 cm RV S prime:     18.60 cm/s TAPSE (M-mode): 2.8 cm  LEFT ATRIUM              Index        RIGHT ATRIUM           Index LA diam:        3.80 cm  1.44 cm/m   RA Area:     16.50 cm LA Vol (A2C):   115.0 ml 43.63 ml/m  RA Volume:   42.20 ml  16.01 ml/m LA Vol (A4C):   47.6 ml  18.06 ml/m LA Biplane Vol: 78.9 ml  29.94 ml/m AORTIC VALVE AV Area (Vmax):    1.07 cm AV Area (Vmean):   1.08 cm AV Area (VTI):     1.24 cm AV Vmax:           398.00 cm/s AV Vmean:          254.600 cm/s AV VTI:            0.720 m AV Peak  Grad:      63.4 mmHg AV Mean Grad:      31.0 mmHg LVOT Vmax:         135.33 cm/s LVOT Vmean:        87.700 cm/s LVOT VTI:          0.284 m LVOT/AV VTI ratio: 0.39  AORTA Ao Root diam: 3.90 cm Ao Asc diam:  4.00 cm Ao Desc diam: 2.50 cm  MITRAL VALVE MV Area (PHT): 3.64 cm    SHUNTS MV Decel Time: 209 msec    Systemic VTI:  0.28 m MV E velocity: 83.60 cm/s  Systemic Diam: 2.00 cm MV A velocity: 76.25 cm/s MV E/A ratio:  1.10  Jennifer Crape MD Electronically signed by Jennifer Crape MD Signature Date/Time: 10/23/2023/6:02:36 PM    Final      CT SCANS  CT CARDIAC SCORING (SELF PAY ONLY) 11/07/2023  Narrative : CLINICAL DATA:  Cardiovascular Disease Risk stratification  EXAM:  Coronary Calcium Score  TECHNIQUE:  A gated, non-contrast computed tomography scan of the heart was  performed using 3mm slice thickness. Axial images were analyzed on a  dedicated workstation. Calcium scoring of the coronary arteries was  performed using the Agatston method.  FINDINGS:  Coronary Calcium Score:  Left main: 0  Left anterior descending artery: 72.2  Left circumflex artery: 0  Right coronary artery: 4.07  Total: 76.2  Pericardium: Normal.  Ascending Aorta: Normal caliber.  Pulmonary artery: Normal caliber  Non-cardiac: See separate report from [facility].  IMPRESSION:  Coronary calcium score of 76.2. This was 74 percentile for age-, race-,  and sex-matched controls.  Aortic valve calcium score: 2725  RECOMMENDATIONS:  Coronary artery calcium (CAC) score is a strong predictor of  incident coronary heart disease (CHD) and provides predictive  information beyond traditional risk factors. CAC scoring is  reasonable to use in the decision to withhold, postpone, or initiate  statin therapy in intermediate-risk or selected borderline-risk  asymptomatic adults (age 68-75 years and LDL-C >=70 to <190 mg/dL)  who do not have diabetes or established atherosclerotic  cardiovascular disease (ASCVD).* In intermediate-risk (10-year ASCVD  risk >=7.5% to <20%) adults or selected borderline-risk (10-year  ASCVD risk >=5% to <7.5%) adults in whom a CAC score is measured for  the purpose of making a treatment decision the following  recommendations have been made:  If CAC=0, it is reasonable to withhold statin therapy and reassess  in 5 to 10 years, as long as higher risk conditions are absent  (diabetes mellitus, family  history of premature CHD in first degree  relatives (males <55 years; females <65 years), cigarette smoking,  or LDL >=190 mg/dL).  If CAC is 1 to 99, it is reasonable to initiate statin therapy for  patients >=51 years of age.  If CAC is >=100 or >=75th percentile, it is reasonable to initiate  statin therapy at any age.  Cardiology referral should be considered for patients with CAC  scores >=400 or >=75th percentile.  *2018 AHA/ACC/AACVPR/AAPA/ABC/ACPM/ADA/AGS/APhA/ASPC/NLA/PCNA  Guideline on the Management of Blood Cholesterol: A Report of the  American College of Cardiology/American Heart Association Task Force  on Clinical Practice Guidelines. J Am Coll Cardiol.  2019;73(24):3168-3209.   Electronically Signed By: Lamar Fitch M.D. On: 11/07/2023 17:50     ______________________________________________________________________________________________      EKG Interpretation Date/Time:  Friday November 14 2023 08:59:42 EDT Ventricular Rate:  71 PR Interval:    QRS Duration:  80 QT Interval:  368  QTC Calculation: 399 R Axis:   83  Text Interpretation: Atrial fibrillation Low voltage QRS When compared with ECG of 22-Oct-2023 08:39, Atrial fibrillation has replaced Sinus rhythm Confirmed by Monetta Rogue ([id number]) on 11/14/2023 9:14:14 AM   Recent Labs: No results found for requested labs within last 365 days.  Recent Lipid Panel    Component Value Date/Time   CHOL 129 07/04/2021 1525   TRIG 123 07/04/2021 1525   HDL 33 (L) 07/04/2021 1525   CHOLHDL 3.9 07/04/2021 1525   LDLCALC 74 07/04/2021 1525    Physical Exam:    VS:  BP 114/74   Pulse 76   Ht 6' 2 (1.88 m)   Wt (!) 312 lb 12.8 oz (141.9 kg)   SpO2 96%   BMI 40.16 kg/m     Wt Readings from Last 3 Encounters:  11/14/23 (!) 312 lb 12.8 oz (141.9 kg)  10/22/23 (!) 307 lb 12.8 oz (139.6 kg)  05/28/22 (!) 304 lb (137.9 kg)     GEN: BMI exceeds 40 well nourished, well developed in no  acute distress HEENT: Normal NECK: No JVD; No carotid bruits LYMPHATICS: No lymphadenopathy CARDIAC: Distant heart sounds he has a grade 3/6 harsh holosystolic murmur encompasses S2 radiates up to the right carotid no AR regular rhythm RRR,  RESPIRATORY:  Clear to auscultation without rales, wheezing or rhonchi  ABDOMEN: Soft, non-tender, non-distended MUSCULOSKELETAL:  No edema; No deformity  SKIN: Warm and dry NEUROLOGIC:  Alert and oriented x 3 PSYCHIATRIC:  Normal affect    Signed, Rogue Monetta, MD  11/14/2023 9:14 AM    [facility]

## 2023-11-13 NOTE — Progress Notes (Unsigned)
 Cardiology Office Note:    Date:  11/14/2023   ID:  Zachary Andrews, DOB 1955/12/30, MRN [id number]  PCP:  Zachary Lamar CROME, MD  Cardiologist:  Zachary Leiter, MD    Referring MD: Zachary Lamar CROME, MD    ASSESSMENT:    1. Nonrheumatic aortic valve stenosis   2. Coronary artery disease of native artery of native heart with stable angina pectoris (HCC)   3. Hypertensive heart disease without heart failure   4. CKD stage 3b, GFR 30-44 ml/min (HCC)   5. Mixed hyperlipidemia   6. Paroxysmal atrial fibrillation (HCC)   7. Chronic anticoagulation    PLAN:    In order of problems listed above:  Zachary Andrews has symptomatic aortic stenosis severe Advised to undergo left and right heart catheterization for consideration of valve intervention hopefully TAVR Stable CAD he will undergo left heart catheterization unless he had severe obstructive CAD it would not change plans for valve intervention Stable hypertension no recurrent episode of lightheadedness reviewed his home blood pressures which run mostly in the 120-140 systolic range Stable kidney disease improved on his last labs with a GFR of 56 cc/min 1 month ago creatinine 1.38 Continue his current lipid-lowering therapy Prior to leaving the office we did an EKG he is in rate controlled atrial fibrillation and new finding. Stop aspirin  and initiate anticoagulation with Eliquis  1 week ZIO monitor to assess burden of atrial fibrillation and rate control   Next appointment: 6 months   Medication Adjustments/Labs and Tests Ordered: Current medicines are reviewed at length with the patient today.  Concerns regarding medicines are outlined above.  Orders Placed This Encounter  Procedures   CBC   Basic Metabolic Panel (BMET)   EKG 12-Lead   No orders of the defined types were placed in this encounter.    History of Present Illness:    Zachary Andrews is a 68 y.o. male with a hx of CAD previous mild aortic stenosis hypertensive heart disease  without heart failure type 2 diabetes stage III CKD and hyperlipidemia last seen 10/22/2023 after an episode of documented hypotension with new exertional shortness of breath.  Compliance with diet, lifestyle and medications: Yes  He is seen along with his wife today. He has had no further episode of near syncope and no worsening of the shortness of breath No edema orthopnea chest pain or mucosal bleeding he has had no fever or chill I reviewed the results of his echocardiogram with him clinically I think he had more severe than moderate stenosis symptomatic and confirmed on the cardiac CTA with a valve calcium score greater 2000 and a man I advised him to undergo further evaluation potentially for TAVR risk benefits options detailed informed consent obtained.  His echocardiogram after that visit 10/23/2023 showed aortic stenosis had progressed with peak and mean gradients of 63/31 mmHg valve area of 1.2 sonometer squared but his SVI was diminished.  He subsequent underwent CT calcium score the aortic valve reported 2725. Past Medical History:  Diagnosis Date   Abdominal aortic atherosclerosis (HCC) 05/19/2020   05/19/2020: on CT     Acute non-recurrent sinusitis 07/09/2023   07/09/2023     Acute renal insufficiency 09/16/2019   Aortic stenosis, moderate 09/09/2017   Formatting of this note might be different from the original.  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   Carbuncle 08/20/2023   08/20/2023: right axilla  Carotid stenosis, right 09/09/2017   Carpal tunnel syndrome, bilateral 06/01/2015   Central spinal stenosis 12/21/2016   Chronic bilateral low back pain with left-sided sciatica 07/11/2015   Coronary artery disease involving native coronary artery of native heart 12/21/2016   Deviated nasal septum 01/31/2022   01/31/2022, rightward     Elevated LFTs 08/22/2017   ICD-10 cut over    Frequent falls 10/15/2016   H/O spinal stenosis 08/22/2017   Hepatic steatosis 05/19/2020   05/19/2020: on CT     History of cigarette smoking 05/27/2022   Last 2014, 44 pack years     History of pulmonary embolism 09/21/2019   Hypertension 02/06/2015   Hypertensive heart disease 02/06/2015   Moderate LVH     Hypotension 09/16/2019   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   02/07/2022:     Ingrown nail 09/29/2021   Intervertebral disc disorder with radiculopathy of lumbosacral region 10/04/2014   Ischial bursitis 08/03/2019   Lumbar disc disease 08/22/2017   Lumbar radiculopathy 12/25/2015   Lumbar spondylosis 04/29/2016   Lumbar stenosis with neurogenic claudication 12/21/2016   Microalbuminuria 08/22/2017   Mixed hyperlipidemia 03/13/2015   Refusing statin   Nephrolithiasis 12/21/2016   Neuropathy 08/19/2017   From cervical and lumbar spinal stenosis  And carpal tunnel   Non-recurrent unilateral inguinal hernia without obstruction or gangrene 12/23/2016   2018   Numbness 02/06/2015   Obesity 08/22/2017   Obstructive sleep apnea on CPAP 04/10/2016   Managed PULM   Occlusion of right internal carotid artery 12/21/2016   >70%   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   Osteoarthritis of shoulder 12/21/2016   Pain around toenail, right foot 09/29/2021   Pain in left knee 07/08/2017   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 2 diabetes mellitus (HCC) 09/13/2021   Polyp of colon 12/21/2016   Primary osteoarthritis of left hip 11/19/2016   Pulmonary embolism (HCC) 09/21/2019   Rib pain on left side 05/19/2020   05/19/2020:     Risk for falls 04/16/2021   04/16/2021 : high risk, is falling, will not use walker; has poor core   strength and foot neuropathy; declined PT, etc; risk discussed     Sensorineural hearing loss (SNHL) of both ears 04/20/2018   Sexual dysfunction 08/22/2017   Spinal cord compression (HCC) 02/06/2015   Stage 2 chronic kidney disease 09/10/2017   Stage  3a chronic kidney disease (HCC) 10/23/2021   10/23/2021: 56, stable, HTN/DM/VASC     Surgical wound present 12/02/2022   12/02/2022 : LSS     Suspected severe acute respiratory syndrome coronavirus 2 (SARS-CoV-2) infection 09/16/2019   Symptoms involving cardiovascular system 01/12/2019   Tinea unguium 12/21/2016   Tinnitus of both ears 04/20/2018   Type 2 diabetes mellitus without complication, without long-term current use of insulin (HCC) 04/10/2016   Vitamin D deficiency 04/10/2016   Wellness examination 05/09/2022    Current Medications: Current Meds  Medication Sig   amoxicillin  (AMOXIL ) 500 MG capsule Take 4 capsules (2,000 mg total) by mouth daily as needed. Take two hours prior to your dental work.   Ascorbic Acid (VITAMIN C) 1000 MG tablet Take 2,000 mg by mouth daily.   carboxymethylcellulose 1 % ophthalmic solution Apply to eye.   Dulaglutide (TRULICITY) 4.5 MG/0.5ML SOPN Inject 4.5 mg into the skin every 7 (seven) days.   Evolocumab  (REPATHA  SURECLICK) 140 MG/ML SOAJ Inject 1 pen into the skin every 14 (fourteen) days.   fluticasone (FLONASE) 50 MCG/ACT nasal spray 1 spray by Both  Nostrils route daily as needed for Rhinitis.   furosemide  (LASIX ) 20 MG tablet TAKE 1 TABLET BY MOUTH EVERY DAY   lisinopril (ZESTRIL) 20 MG tablet Take 20 mg by mouth daily.   nitroGLYCERIN  (NITROSTAT ) 0.4 MG SL tablet Place 1 tablet (0.4 mg total) under the tongue every 5 (five) minutes as needed for chest pain.   pregabalin (LYRICA) 50 MG capsule Take 50 mg by mouth at bedtime.   traMADol (ULTRAM) 50 MG tablet Take 50 mg by mouth every 6 (six) hours as needed for moderate pain (pain score 4-6) or severe pain (pain score 7-10).   [DISCONTINUED] aspirin  EC 81 MG tablet Take 1 tablet (81 mg total) by mouth daily.      EKGs/Labs/Other Studies Reviewed:    The following studies were reviewed today:  Cardiac Studies & Procedures    ______________________________________________________________________________________________   STRESS TESTS  MYOCARDIAL PERFUSION IMAGING 10/13/2019  Interpretation Summary  The left ventricular ejection fraction is normal (55-65%).  Nuclear stress EF: 63%.  There was no ST segment deviation noted during stress.  This is a low risk study.   ECHOCARDIOGRAM  ECHOCARDIOGRAM COMPLETE 10/23/2023  Narrative ECHOCARDIOGRAM REPORT    Patient Name:   Zachary Andrews Date of Exam: 10/23/2023 Medical Rec #:  [id number]  Height:       75.0 in Accession #:    [id number] Weight:       307.8 lb Date of Birth:  [birth year] BSA:          2.636 m Patient Age:    67 years   BP:           132/82 mmHg Patient Gender: M          HR:           63 bpm. Exam Location:  [facility]  Procedure: 2D Echo, Cardiac Doppler, Color Doppler and Strain Analysis (Both Spectral and Color Flow Doppler were utilized during procedure).  Indications:    Coronary artery disease of native artery of native heart with stable angina pectoris (HCC) [I25.118]. Nonrheumatic aortic valve stenosis [I35.0]. Hypertensive heart disease without heart failure [I11.9]. CKD stage 3b, GFR 30-44 ml/min (HCC) [N18.32]. Mixed hyperlipidemia [E78.2] Aortic stenosis I35.0  History:        Patient has prior history of Echocardiogram examinations, most recent 07/04/2021. CAD, Aortic Valve Disease; Risk Factors:Hypertension and Dyslipidemia.  Sonographer:    Charlie Jointer RDCS Referring Phys: [id number] Rayanne Padmanabhan J Tommy Minichiello  IMPRESSIONS   1. Left ventricular ejection fraction, by estimation, is 60 to 65%. The left ventricle has normal function. The left ventricle demonstrates global hypokinesis. There is moderate left ventricular hypertrophy. Left ventricular diastolic parameters are indeterminate. The average left ventricular global longitudinal strain is -14.7 %. The global longitudinal strain is abnormal. 2. Right ventricular systolic  function is normal. The right ventricular size is normal. 3. The mitral valve is normal in structure. No evidence of mitral valve regurgitation. No evidence of mitral stenosis. 4. The aortic valve was not well visualized. Aortic valve regurgitation is not visualized. Moderate aortic valve stenosis. 5. Aneurysm of the ascending aorta, measuring 40 mm. 6. The inferior vena cava is normal in size with greater than 50% respiratory variability, suggesting right atrial pressure of 3 mmHg.  FINDINGS Left Ventricle: Left ventricular ejection fraction, by estimation, is 60 to 65%. The left ventricle has normal function. The left ventricle demonstrates global hypokinesis. The average left ventricular global longitudinal strain is -14.7 %. Strain was performed and the global longitudinal  strain is abnormal. The left ventricular internal cavity size was normal in size. There is moderate left ventricular hypertrophy. Left ventricular diastolic parameters are indeterminate.  Right Ventricle: The right ventricular size is normal. No increase in right ventricular wall thickness. Right ventricular systolic function is normal.  Left Atrium: Left atrial size was normal in size.  Right Atrium: Right atrial size was normal in size.  Pericardium: There is no evidence of pericardial effusion.  Mitral Valve: The mitral valve is normal in structure. No evidence of mitral valve regurgitation. No evidence of mitral valve stenosis.  Tricuspid Valve: The tricuspid valve is normal in structure. Tricuspid valve regurgitation is not demonstrated. No evidence of tricuspid stenosis.  Aortic Valve: The aortic valve was not well visualized. Aortic valve regurgitation is not visualized. Moderate aortic stenosis is present. Aortic valve mean gradient measures 31.0 mmHg. Aortic valve peak gradient measures 63.4 mmHg. Aortic valve area, by VTI measures 1.24 cm.  Pulmonic Valve: The pulmonic valve was normal in structure. Pulmonic  valve regurgitation is not visualized. No evidence of pulmonic stenosis.  Aorta: The aortic root is normal in size and structure. There is an aneurysm involving the ascending aorta measuring 40 mm.  Venous: The inferior vena cava is normal in size with greater than 50% respiratory variability, suggesting right atrial pressure of 3 mmHg.  IAS/Shunts: No atrial level shunt detected by color flow Doppler.   LEFT VENTRICLE PLAX 2D LVIDd:         5.00 cm   Diastology LVIDs:         3.00 cm   LV e' medial:    6.06 cm/s LV PW:         1.50 cm   LV E/e' medial:  13.8 LV IVS:        1.50 cm   LV e' lateral:   5.87 cm/s LVOT diam:     2.00 cm   LV E/e' lateral: 14.2 LV SV:         89 LV SV Index:   34        2D Longitudinal Strain LVOT Area:     3.14 cm  2D Strain GLS Avg:     -14.7 %   RIGHT VENTRICLE             IVC RV Basal diam:  4.10 cm     IVC diam: 2.80 cm RV Mid diam:    2.70 cm RV S prime:     18.60 cm/s TAPSE (M-mode): 2.8 cm  LEFT ATRIUM              Index        RIGHT ATRIUM           Index LA diam:        3.80 cm  1.44 cm/m   RA Area:     16.50 cm LA Vol (A2C):   115.0 ml 43.63 ml/m  RA Volume:   42.20 ml  16.01 ml/m LA Vol (A4C):   47.6 ml  18.06 ml/m LA Biplane Vol: 78.9 ml  29.94 ml/m AORTIC VALVE AV Area (Vmax):    1.07 cm AV Area (Vmean):   1.08 cm AV Area (VTI):     1.24 cm AV Vmax:           398.00 cm/s AV Vmean:          254.600 cm/s AV VTI:            0.720 m AV Peak  Grad:      63.4 mmHg AV Mean Grad:      31.0 mmHg LVOT Vmax:         135.33 cm/s LVOT Vmean:        87.700 cm/s LVOT VTI:          0.284 m LVOT/AV VTI ratio: 0.39  AORTA Ao Root diam: 3.90 cm Ao Asc diam:  4.00 cm Ao Desc diam: 2.50 cm  MITRAL VALVE MV Area (PHT): 3.64 cm    SHUNTS MV Decel Time: 209 msec    Systemic VTI:  0.28 m MV E velocity: 83.60 cm/s  Systemic Diam: 2.00 cm MV A velocity: 76.25 cm/s MV E/A ratio:  1.10  Jennifer Crape MD Electronically signed by Jennifer Crape MD Signature Date/Time: 10/23/2023/6:02:36 PM    Final      CT SCANS  CT CARDIAC SCORING (SELF PAY ONLY) 11/07/2023  Narrative : CLINICAL DATA:  Cardiovascular Disease Risk stratification  EXAM:  Coronary Calcium Score  TECHNIQUE:  A gated, non-contrast computed tomography scan of the heart was  performed using 3mm slice thickness. Axial images were analyzed on a  dedicated workstation. Calcium scoring of the coronary arteries was  performed using the Agatston method.  FINDINGS:  Coronary Calcium Score:  Left main: 0  Left anterior descending artery: 72.2  Left circumflex artery: 0  Right coronary artery: 4.07  Total: 76.2  Pericardium: Normal.  Ascending Aorta: Normal caliber.  Pulmonary artery: Normal caliber  Non-cardiac: See separate report from [facility].  IMPRESSION:  Coronary calcium score of 76.2. This was 74 percentile for age-, race-,  and sex-matched controls.  Aortic valve calcium score: 2725  RECOMMENDATIONS:  Coronary artery calcium (CAC) score is a strong predictor of  incident coronary heart disease (CHD) and provides predictive  information beyond traditional risk factors. CAC scoring is  reasonable to use in the decision to withhold, postpone, or initiate  statin therapy in intermediate-risk or selected borderline-risk  asymptomatic adults (age 68-75 years and LDL-C >=70 to <190 mg/dL)  who do not have diabetes or established atherosclerotic  cardiovascular disease (ASCVD).* In intermediate-risk (10-year ASCVD  risk >=7.5% to <20%) adults or selected borderline-risk (10-year  ASCVD risk >=5% to <7.5%) adults in whom a CAC score is measured for  the purpose of making a treatment decision the following  recommendations have been made:  If CAC=0, it is reasonable to withhold statin therapy and reassess  in 5 to 10 years, as long as higher risk conditions are absent  (diabetes mellitus, family  history of premature CHD in first degree  relatives (males <55 years; females <65 years), cigarette smoking,  or LDL >=190 mg/dL).  If CAC is 1 to 99, it is reasonable to initiate statin therapy for  patients >=51 years of age.  If CAC is >=100 or >=75th percentile, it is reasonable to initiate  statin therapy at any age.  Cardiology referral should be considered for patients with CAC  scores >=400 or >=75th percentile.  *2018 AHA/ACC/AACVPR/AAPA/ABC/ACPM/ADA/AGS/APhA/ASPC/NLA/PCNA  Guideline on the Management of Blood Cholesterol: A Report of the  American College of Cardiology/American Heart Association Task Force  on Clinical Practice Guidelines. J Am Coll Cardiol.  2019;73(24):3168-3209.   Electronically Signed By: Lamar Fitch M.D. On: 11/07/2023 17:50     ______________________________________________________________________________________________      EKG Interpretation Date/Time:  Friday November 14 2023 08:59:42 EDT Ventricular Rate:  71 PR Interval:    QRS Duration:  80 QT Interval:  368  QTC Calculation: 399 R Axis:   83  Text Interpretation: Atrial fibrillation Low voltage QRS When compared with ECG of 22-Oct-2023 08:39, Atrial fibrillation has replaced Sinus rhythm Confirmed by Monetta Rogue ([id number]) on 11/14/2023 9:14:14 AM   Recent Labs: No results found for requested labs within last 365 days.  Recent Lipid Panel    Component Value Date/Time   CHOL 129 07/04/2021 1525   TRIG 123 07/04/2021 1525   HDL 33 (L) 07/04/2021 1525   CHOLHDL 3.9 07/04/2021 1525   LDLCALC 74 07/04/2021 1525    Physical Exam:    VS:  BP 114/74   Pulse 76   Ht 6' 2 (1.88 m)   Wt (!) 312 lb 12.8 oz (141.9 kg)   SpO2 96%   BMI 40.16 kg/m     Wt Readings from Last 3 Encounters:  11/14/23 (!) 312 lb 12.8 oz (141.9 kg)  10/22/23 (!) 307 lb 12.8 oz (139.6 kg)  05/28/22 (!) 304 lb (137.9 kg)     GEN: BMI exceeds 40 well nourished, well developed in no  acute distress HEENT: Normal NECK: No JVD; No carotid bruits LYMPHATICS: No lymphadenopathy CARDIAC: Distant heart sounds he has a grade 3/6 harsh holosystolic murmur encompasses S2 radiates up to the right carotid no AR regular rhythm RRR,  RESPIRATORY:  Clear to auscultation without rales, wheezing or rhonchi  ABDOMEN: Soft, non-tender, non-distended MUSCULOSKELETAL:  No edema; No deformity  SKIN: Warm and dry NEUROLOGIC:  Alert and oriented x 3 PSYCHIATRIC:  Normal affect    Signed, Rogue Monetta, MD  11/14/2023 9:14 AM    [facility]

## 2023-11-14 ENCOUNTER — Ambulatory Visit: Attending: Cardiology

## 2023-11-14 ENCOUNTER — Ambulatory Visit: Attending: Cardiology | Admitting: Cardiology

## 2023-11-14 ENCOUNTER — Encounter: Payer: Self-pay | Admitting: Cardiology

## 2023-11-14 VITALS — BP 114/74 | HR 76 | Ht 74.0 in | Wt 312.8 lb

## 2023-11-14 DIAGNOSIS — I25118 Atherosclerotic heart disease of native coronary artery with other forms of angina pectoris: Secondary | ICD-10-CM

## 2023-11-14 DIAGNOSIS — I350 Nonrheumatic aortic (valve) stenosis: Secondary | ICD-10-CM | POA: Diagnosis not present

## 2023-11-14 DIAGNOSIS — I119 Hypertensive heart disease without heart failure: Secondary | ICD-10-CM

## 2023-11-14 DIAGNOSIS — E782 Mixed hyperlipidemia: Secondary | ICD-10-CM

## 2023-11-14 DIAGNOSIS — Z7901 Long term (current) use of anticoagulants: Secondary | ICD-10-CM

## 2023-11-14 DIAGNOSIS — I480 Paroxysmal atrial fibrillation: Secondary | ICD-10-CM

## 2023-11-14 DIAGNOSIS — N1832 Chronic kidney disease, stage 3b: Secondary | ICD-10-CM

## 2023-11-14 MED ORDER — APIXABAN 5 MG PO TABS
5.0000 mg | ORAL_TABLET | Freq: Two times a day (BID) | ORAL | 3 refills | Status: DC
Start: 2023-11-14 — End: 2023-12-29

## 2023-11-14 NOTE — Patient Instructions (Addendum)
 Medication Instructions:  Your physician recommends that you continue on your current medications as directed. Please refer to the Current Medication list given to you today.  *If you need a refill on your cardiac medications before your next appointment, please call your pharmacy*  Lab Work: Your physician recommends that you return for lab work in:   Labs today: CBC, BMP  If you have labs (blood work) drawn today and your tests are completely normal, you will receive your results only by: MyChart Message (if you have MyChart) OR A paper copy in the mail If you have any lab test that is abnormal or we need to change your treatment, we will call you to review the results.  Testing/Procedures: A zio monitor was ordered today. It will remain on for 7 days. You will then return monitor and event diary in provided box. It takes 1-2 weeks for report to be downloaded and returned to us . We will call you with the results. If monitor falls off or has orange flashing light, please call Zio for further instructions.    [facility] A DEPT OF [place] [place] [address] [place] [address] Dept: [phone] Loc: [phone]  Zachary Andrews  11/14/2023  You are scheduled for a Cardiac Catheterization on Thursday, October 2 with Dr. Lurena Red.  1. Please arrive at the [facility] (Main Entrance A) at [facility]: [address] [place], [place] [address] at 5:30 AM (This time is 2 hour(s) before your procedure to ensure your preparation).   Free valet parking service is available. You will check in at ADMITTING. The support person will be asked to wait in the waiting room.  It is OK to have someone drop you off and come back when you are ready to be discharged.    Special note: Every effort is made to have your procedure done on time. Please understand that emergencies sometimes delay scheduled procedures.  2. Diet:  Nothing to eat after midnight.   3. Hydration: You need to be well hydrated before your procedure. On October 2, you may drink approved liquids (see below) until 2 hours before the procedure, with 16 oz of water as your last intake.   List of approved liquids water, clear juice, clear tea, black coffee, fruit juices, non-citric and without pulp, carbonated beverages, Gatorade, Kool -Aid, plain Jello-O and plain ice popsicles.  4. Labs: You will need to have blood drawn on Friday, September 19 at [facility]: [address] [place], [place] . You do not need to be fasting.  5. Medication instructions in preparation for your procedure:   Contrast Allergy: No  Stop taking, Lasix  (Furosemide )  Thursday, October 2,  Hold Eliquis  2 days before cardiac cath  6. Plan to go home the same day, you will only stay overnight if medically necessary. 7. Bring a current list of your medications and current insurance cards. 8. You MUST have a responsible person to drive you home. 9. Someone MUST be with you the first 24 hours after you arrive home or your discharge will be delayed. 10. Please wear clothes that are easy to get on and off and wear slip-on shoes.  Thank you for allowing us  to care for you!   -- [facility]   Follow-Up: At [facility], you and your health needs are our priority.  As part of our continuing mission to provide you with exceptional  heart care, our providers are all part of one team.  This team includes your primary Cardiologist (physician) and Advanced Practice Providers or APPs (Physician Assistants and Nurse Practitioners) who all work together to provide you with the care you need, when you need it.  Your next appointment:   Follow up to be determined after the cardiac cath.  Provider:   Redell Leiter, MD    We recommend signing up for the patient portal called MyChart.  Sign up information is provided on this After Visit  Summary.  MyChart is used to connect with patients for Virtual Visits (Telemedicine).  Patients are able to view lab/test results, encounter notes, upcoming appointments, etc.  Non-urgent messages can be sent to your provider as well.   To learn more about what you can do with MyChart, go to [email or web address].   Other Instructions None

## 2023-11-14 NOTE — Addendum Note (Signed)
 Addended by: SHERRE ADE I on: 11/14/2023 09:35 AM   Modules accepted: Orders

## 2023-11-14 NOTE — Addendum Note (Signed)
 Addended by: SHERRE ADE I on: 11/14/2023 09:53 AM   Modules accepted: Orders

## 2023-11-15 LAB — CBC
Hematocrit: 44 % (ref 37.5–51.0)
Hemoglobin: 14.3 g/dL (ref 13.0–17.7)
MCH: 30.2 pg (ref 26.6–33.0)
MCHC: 32.5 g/dL (ref 31.5–35.7)
MCV: 93 fL (ref 79–97)
Platelets: 164 x10E3/uL (ref 150–450)
RBC: 4.74 x10E6/uL (ref 4.14–5.80)
RDW: 13.3 % (ref 11.6–15.4)
WBC: 7.1 x10E3/uL (ref 3.4–10.8)

## 2023-11-15 LAB — BASIC METABOLIC PANEL WITH GFR
BUN/Creatinine Ratio: 17 (ref 10–24)
BUN: 22 mg/dL (ref 8–27)
CO2: 24 mmol/L (ref 20–29)
Calcium: 9.1 mg/dL (ref 8.6–10.2)
Chloride: 109 mmol/L — ABNORMAL HIGH (ref 96–106)
Creatinine, Ser: 1.26 mg/dL (ref 0.76–1.27)
Glucose: 112 mg/dL — ABNORMAL HIGH (ref 70–99)
Potassium: 4.5 mmol/L (ref 3.5–5.2)
Sodium: 145 mmol/L — ABNORMAL HIGH (ref 134–144)
eGFR: 63 mL/min/1.73 (ref >59)

## 2023-11-16 ENCOUNTER — Ambulatory Visit: Payer: Self-pay | Admitting: Cardiology

## 2023-11-17 ENCOUNTER — Other Ambulatory Visit: Payer: Self-pay

## 2023-11-17 ENCOUNTER — Telehealth: Payer: Self-pay | Admitting: *Deleted

## 2023-11-17 DIAGNOSIS — R319 Hematuria, unspecified: Secondary | ICD-10-CM

## 2023-11-17 NOTE — Telephone Encounter (Signed)
 This email is to notify you that the patient had a fall off [1 DAYS] into the wear time and we were unable to resolve the issue with troubleshooting. We have placed an order for a replacement device to be shipped to the patient and placed a hold on the billing, so the patient is not charged for the first device.

## 2023-11-20 ENCOUNTER — Ambulatory Visit: Payer: Self-pay | Admitting: Cardiology

## 2023-11-20 LAB — URINALYSIS

## 2023-11-21 ENCOUNTER — Other Ambulatory Visit: Payer: Self-pay

## 2023-11-21 DIAGNOSIS — R319 Hematuria, unspecified: Secondary | ICD-10-CM

## 2023-11-21 NOTE — Progress Notes (Signed)
 Left message for the patient to call back.

## 2023-11-25 ENCOUNTER — Telehealth: Payer: Self-pay | Admitting: *Deleted

## 2023-11-25 NOTE — Telephone Encounter (Signed)
 Cardiac Catheterization scheduled at [facility] for: Thursday November 27, 2023 7:30 AM Arrival time [facility] Main Entrance A at: 5:30 AM  Diet: -Nothing to eat after midnight.  Hydration: -May drink clear liquids until 2 hours before the procedure.  Approved liquids: Water, clear tea, black coffee, fruit juices-non-citric and without pulp,Gatorade, plain Jello/popsicles.   -Please drink 16 oz of water 2 hours before procedure.  Medication instructions: -Hold:  Eliquis -none 11/25/23 until post procedure  Pt tells me because of hematuria he stopped Eliquis  on his own last Tuesday 9/23.  Lasix -AM of procedure -Usual morning medications can be taken including aspirin  81 mg.  Pt tells me he takes Trulicity weekly on Sundays.  Plan to go home the same day, you will only stay overnight if medically necessary.  You must have responsible adult to drive you home.  Someone must be with you the first 24 hours after you arrive home.  Reviewed procedure instructions with patient.

## 2023-11-25 NOTE — Telephone Encounter (Signed)
 Patient tells me he has a known kidney stone, noticed blood in his urine last Monday 9/22. Patient states because of hematuria, on his own, he stopped taking Eliquis  Tuesday 9/23 and started aspirin  81 mg daily.  Patient tells me hematuria resolved after stopping Eliquis , he has been in touch with urologist and they are supposed to making follow up appointment for him. Patient tells me urologist knows cath scheduled for 10/2, but he has not been given any recommendation about if it will be okay to restart Eliquis  after cath 10/2. Patient states he is ready to proceed with cath 10/2.  Patient advised I will forward to Dr Monetta for his review and recommendations.

## 2023-11-26 NOTE — Telephone Encounter (Signed)
 Called the patient's wife and informed her of Dr. Leandrew recommendation to continue with the cardiac cath tomorrow on 10/2.  2 part answered first is a kidney stone hematuria should not really affect the plans for cath the second is I am pretty sure you supposed to get a UA done and he has not had it done yet. Proceed with cardiac cath  [facility] verbalized understanding and had no further questions at this time.

## 2023-11-27 ENCOUNTER — Other Ambulatory Visit: Payer: Self-pay | Admitting: Cardiology

## 2023-11-27 ENCOUNTER — Ambulatory Visit (HOSPITAL_COMMUNITY)
Admission: RE | Admit: 2023-11-27 | Discharge: 2023-11-27 | Disposition: A | Discharge status: Home / Self Care | Attending: Internal Medicine | Admitting: Internal Medicine

## 2023-11-27 ENCOUNTER — Other Ambulatory Visit: Payer: Self-pay

## 2023-11-27 ENCOUNTER — Encounter (HOSPITAL_COMMUNITY): Admission: RE | Disposition: A | Payer: Self-pay | Source: Home / Self Care | Attending: Internal Medicine

## 2023-11-27 DIAGNOSIS — I25118 Atherosclerotic heart disease of native coronary artery with other forms of angina pectoris: Secondary | ICD-10-CM | POA: Diagnosis not present

## 2023-11-27 DIAGNOSIS — N1832 Chronic kidney disease, stage 3b: Secondary | ICD-10-CM | POA: Diagnosis not present

## 2023-11-27 DIAGNOSIS — Z7985 Long-term (current) use of injectable non-insulin antidiabetic drugs: Secondary | ICD-10-CM | POA: Diagnosis not present

## 2023-11-27 DIAGNOSIS — Z79899 Other long term (current) drug therapy: Secondary | ICD-10-CM | POA: Diagnosis not present

## 2023-11-27 DIAGNOSIS — I1310 Hypertensive heart and chronic kidney disease without heart failure, with stage 1 through stage 4 chronic kidney disease, or unspecified chronic kidney disease: Secondary | ICD-10-CM | POA: Diagnosis not present

## 2023-11-27 DIAGNOSIS — Z7901 Long term (current) use of anticoagulants: Secondary | ICD-10-CM | POA: Insufficient documentation

## 2023-11-27 DIAGNOSIS — E1122 Type 2 diabetes mellitus with diabetic chronic kidney disease: Secondary | ICD-10-CM | POA: Diagnosis not present

## 2023-11-27 DIAGNOSIS — I480 Paroxysmal atrial fibrillation: Secondary | ICD-10-CM | POA: Diagnosis not present

## 2023-11-27 DIAGNOSIS — I350 Nonrheumatic aortic (valve) stenosis: Secondary | ICD-10-CM | POA: Insufficient documentation

## 2023-11-27 DIAGNOSIS — E782 Mixed hyperlipidemia: Secondary | ICD-10-CM | POA: Insufficient documentation

## 2023-11-27 HISTORY — PX: RIGHT HEART CATH AND CORONARY ANGIOGRAPHY: CATH118264

## 2023-11-27 LAB — POCT I-STAT 7, (LYTES, BLD GAS, ICA,H+H)
Acid-Base Excess: 0 mmol/L (ref 0.0–2.0)
Bicarbonate: 24.6 mmol/L (ref 20.0–28.0)
Calcium, Ion: 1.2 mmol/L (ref 1.15–1.40)
HCT: 41 % (ref 39.0–52.0)
Hemoglobin: 13.9 g/dL (ref 13.0–17.0)
O2 Saturation: 95 %
Potassium: 4.3 mmol/L (ref 3.5–5.1)
Sodium: 141 mmol/L (ref 135–145)
TCO2: 26 mmol/L (ref 22–32)
pCO2 arterial: 40.6 mmHg (ref 32–48)
pH, Arterial: 7.39 (ref 7.35–7.45)
pO2, Arterial: 79 mmHg — ABNORMAL LOW (ref 83–108)

## 2023-11-27 LAB — POCT I-STAT EG7
Acid-Base Excess: 0 mmol/L (ref 0.0–2.0)
Acid-Base Excess: 1 mmol/L (ref 0.0–2.0)
Bicarbonate: 25.8 mmol/L (ref 20.0–28.0)
Bicarbonate: 26.6 mmol/L (ref 20.0–28.0)
Calcium, Ion: 1.18 mmol/L (ref 1.15–1.40)
Calcium, Ion: 1.2 mmol/L (ref 1.15–1.40)
HCT: 41 % (ref 39.0–52.0)
HCT: 42 % (ref 39.0–52.0)
Hemoglobin: 13.9 g/dL (ref 13.0–17.0)
Hemoglobin: 14.3 g/dL (ref 13.0–17.0)
O2 Saturation: 67 %
O2 Saturation: 77 %
Potassium: 4.2 mmol/L (ref 3.5–5.1)
Potassium: 4.3 mmol/L (ref 3.5–5.1)
Sodium: 142 mmol/L (ref 135–145)
Sodium: 142 mmol/L (ref 135–145)
TCO2: 27 mmol/L (ref 22–32)
TCO2: 28 mmol/L (ref 22–32)
pCO2, Ven: 45.8 mmHg (ref 44–60)
pCO2, Ven: 46 mmHg (ref 44–60)
pH, Ven: 7.359 (ref 7.25–7.43)
pH, Ven: 7.37 (ref 7.25–7.43)
pO2, Ven: 36 mmHg (ref 32–45)
pO2, Ven: 43 mmHg (ref 32–45)

## 2023-11-27 LAB — CBC
HCT: 44.2 % (ref 39.0–52.0)
Hemoglobin: 14.5 g/dL (ref 13.0–17.0)
MCH: 30.3 pg (ref 26.0–34.0)
MCHC: 32.8 g/dL (ref 30.0–36.0)
MCV: 92.5 fL (ref 80.0–100.0)
Platelets: 192 K/uL (ref 150–400)
RBC: 4.78 MIL/uL (ref 4.22–5.81)
RDW: 13.5 % (ref 11.5–15.5)
WBC: 7.8 K/uL (ref 4.0–10.5)
nRBC: 0 % (ref 0.0–0.2)

## 2023-11-27 LAB — GLUCOSE, CAPILLARY: Glucose-Capillary: 120 mg/dL — ABNORMAL HIGH (ref 70–99)

## 2023-11-27 SURGERY — RIGHT HEART CATH AND CORONARY ANGIOGRAPHY
Anesthesia: LOCAL

## 2023-11-27 MED ORDER — MIDAZOLAM HCL 2 MG/2ML IJ SOLN
INTRAMUSCULAR | Status: DC | PRN
  Administered 2023-11-27 (×2): 1 mg via INTRAVENOUS

## 2023-11-27 MED ORDER — ONDANSETRON HCL 4 MG/2ML IJ SOLN: 4.0000 mg | Freq: Four times a day (QID) | INTRAMUSCULAR | Status: DC | PRN

## 2023-11-27 MED ORDER — HEPARIN SODIUM (PORCINE) 1000 UNIT/ML IJ SOLN
INTRAMUSCULAR | Status: AC
  Filled 2023-11-27: qty 10

## 2023-11-27 MED ORDER — VERAPAMIL HCL 2.5 MG/ML IV SOLN
INTRAVENOUS | Status: DC | PRN
  Administered 2023-11-27: 5 mL via INTRA_ARTERIAL

## 2023-11-27 MED ORDER — SODIUM CHLORIDE 0.9% FLUSH: 3.0000 mL | INTRAVENOUS | Status: DC | PRN

## 2023-11-27 MED ORDER — HYDRALAZINE HCL 20 MG/ML IJ SOLN: 10.0000 mg | INTRAMUSCULAR | Status: DC | PRN

## 2023-11-27 MED ORDER — SODIUM CHLORIDE 0.9 % IV SOLN: 250.0000 mL | INTRAVENOUS | Status: DC | PRN

## 2023-11-27 MED ORDER — ASPIRIN 81 MG PO CHEW
81.0000 mg | CHEWABLE_TABLET | ORAL | Status: AC
  Administered 2023-11-27: 81 mg via ORAL
  Filled 2023-11-27: qty 1

## 2023-11-27 MED ORDER — ACETAMINOPHEN 325 MG PO TABS: 650.0000 mg | ORAL_TABLET | ORAL | Status: DC | PRN

## 2023-11-27 MED ORDER — LABETALOL HCL 5 MG/ML IV SOLN: 10.0000 mg | INTRAVENOUS | Status: DC | PRN

## 2023-11-27 MED ORDER — FUROSEMIDE 20 MG PO TABS: 20.0000 mg | ORAL_TABLET | Freq: Every day | ORAL | 3 refills | Status: AC

## 2023-11-27 MED ORDER — VERAPAMIL HCL 2.5 MG/ML IV SOLN: INTRAVENOUS | Status: DC | PRN

## 2023-11-27 MED ORDER — HEPARIN (PORCINE) IN NACL 1000-0.9 UT/500ML-% IV SOLN
INTRAVENOUS | Status: DC | PRN
  Administered 2023-11-27: 1000 mL

## 2023-11-27 MED ORDER — FREE WATER: 500.0000 mL | Freq: Once | Status: DC

## 2023-11-27 MED ORDER — FENTANYL CITRATE (PF) 100 MCG/2ML IJ SOLN
INTRAMUSCULAR | Status: AC
  Filled 2023-11-27: qty 2

## 2023-11-27 MED ORDER — VERAPAMIL HCL 2.5 MG/ML IV SOLN
INTRAVENOUS | Status: AC
  Filled 2023-11-27: qty 2

## 2023-11-27 MED ORDER — FREE WATER: 250.0000 mL | Freq: Once | Status: DC

## 2023-11-27 MED ORDER — LIDOCAINE HCL (PF) 1 % IJ SOLN
INTRAMUSCULAR | Status: DC | PRN
  Administered 2023-11-27 (×2): 2 mL via INTRADERMAL

## 2023-11-27 MED ORDER — IOHEXOL 350 MG/ML SOLN
INTRAVENOUS | Status: DC | PRN
  Administered 2023-11-27: 65 mL

## 2023-11-27 MED ORDER — HEPARIN SODIUM (PORCINE) 1000 UNIT/ML IJ SOLN
INTRAMUSCULAR | Status: DC | PRN
  Administered 2023-11-27: 5000 [IU] via INTRA_ARTERIAL

## 2023-11-27 MED ORDER — LIDOCAINE HCL (PF) 1 % IJ SOLN
INTRAMUSCULAR | Status: AC
  Filled 2023-11-27: qty 30

## 2023-11-27 MED ORDER — SODIUM CHLORIDE 0.9% FLUSH: 3.0000 mL | Freq: Two times a day (BID) | INTRAVENOUS | Status: DC

## 2023-11-27 MED ORDER — MIDAZOLAM HCL 2 MG/2ML IJ SOLN
INTRAMUSCULAR | Status: AC
  Filled 2023-11-27: qty 2

## 2023-11-27 MED ORDER — FENTANYL CITRATE (PF) 100 MCG/2ML IJ SOLN
INTRAMUSCULAR | Status: DC | PRN
  Administered 2023-11-27 (×2): 25 ug via INTRAVENOUS

## 2023-11-27 SURGICAL SUPPLY — 13 items
BAG SNAP BAND KOVER 36X36 (MISCELLANEOUS) IMPLANT
CATH BALLN WEDGE 5F 110CM (CATHETERS) IMPLANT
CATH INFINITI 5 FR JL3.5 (CATHETERS) IMPLANT
CATH INFINITI 5FR ANG PIGTAIL (CATHETERS) IMPLANT
CATH INFINITI AMBI 6FR TG (CATHETERS) IMPLANT
DEVICE RAD COMP TR BAND LRG (VASCULAR PRODUCTS) IMPLANT
GLIDESHEATH SLEND SS 6F .021 (SHEATH) IMPLANT
GUIDEWIRE .025 260CM (WIRE) IMPLANT
KIT SYRINGE INJ CVI SPIKEX1 (MISCELLANEOUS) IMPLANT
PACK CARDIAC CATHETERIZATION (CUSTOM PROCEDURE TRAY) ×1 IMPLANT
SET ATX-X65L (MISCELLANEOUS) IMPLANT
SHEATH GLIDE SLENDER 4/5FR (SHEATH) IMPLANT
WIRE EMERALD 3MM-J .035X260CM (WIRE) IMPLANT

## 2023-11-27 NOTE — Progress Notes (Signed)
 Discharge instructions reviewed with patient and significant other  Barbara at bedside. Denies questions or concerns. TR Band removed, pt ambulated to the bathroom where he was able to void without difficulty. No s/s of complications at the incision site. Pt escorted from the unit via wheel chair to personal vehicle.

## 2023-11-27 NOTE — Discharge Instructions (Addendum)
 Continue your aspirin .  Continue to hold your Eliquis  until your other doctors say it is ok to restart  Start taking your lasix  every day, will have you get blood work in one week  Drink plenty of fluids for 48 hours and keep wrist elevated at heart level for 24 hours  Radial Site Care   This sheet gives you information about how to care for yourself after your procedure. Your health care provider may also give you more specific instructions. If you have problems or questions, contact your health care provider. What can I expect after the procedure? After the procedure, it is common to have: Bruising and tenderness at the catheter insertion area. Follow these instructions at home: Medicines Take over-the-counter and prescription medicines only as told by your health care provider. Insertion site care Follow instructions from your health care provider about how to take care of your insertion site. Make sure you: Wash your hands with soap and water before you change your bandage (dressing). If soap and water are not available, use hand sanitizer. Remove your dressing as told by your health care provider. In 24 hours Check your insertion site every day for signs of infection. Check for: Redness, swelling, or pain. Fluid or blood. Pus or a bad smell. Warmth. Do not take baths, swim, or use a hot tub until your health care provider approves. You may shower 24-48 hours after the procedure, or as directed by your health care provider. Remove the dressing and gently wash the site with plain soap and water. Pat the area dry with a clean towel. Do not rub the site. That could cause bleeding. Do not apply powder or lotion to the site. Activity   For 24 hours after the procedure, or as directed by your health care provider: Do not flex or bend the affected arm. Do not push or pull heavy objects with the affected arm. Do not drive yourself home from the hospital or clinic. You may drive 24  hours after the procedure unless your health care provider tells you not to. Do not operate machinery or power tools. Do not lift anything that is heavier than 10 lb (4.5 kg), or the limit that you are told, until your health care provider says that it is safe.  For 4 days Ask your health care provider when it is okay to: Return to work or school. Resume usual physical activities or sports. Resume sexual activity. General instructions If the catheter site starts to bleed, raise your arm and put firm pressure on the site. If the bleeding does not stop, get help right away. This is a medical emergency. If you went home on the same day as your procedure, a responsible adult should be with you for the first 24 hours after you arrive home. Keep all follow-up visits as told by your health care provider. This is important. Contact a health care provider if: You have a fever. You have redness, swelling, or yellow drainage around your insertion site. Get help right away if: You have unusual pain at the radial site. The catheter insertion area swells very fast. The insertion area is bleeding, and the bleeding does not stop when you hold steady pressure on the area. Your arm or hand becomes pale, cool, tingly, or numb. These symptoms may represent a serious problem that is an emergency. Do not wait to see if the symptoms will go away. Get medical help right away. Call your local emergency services (911 in the [place]  Do not drive yourself to the hospital. Summary After the procedure, it is common to have bruising and tenderness at the site. Follow instructions from your health care provider about how to take care of your radial site wound. Check the wound every day for signs of infection. Do not lift anything that is heavier than 10 lb (4.5 kg), or the limit that you are told, until your health care provider says that it is safe. This information is not intended to replace advice given to you by your  health care provider. Make sure you discuss any questions you have with your health care provider. Document Revised: 03/19/2017 Document Reviewed: 03/19/2017 Elsevier Patient Education  2020 [employer]     Brachial Site Care   This sheet gives you information about how to care for yourself after your procedure. Your health care provider may also give you more specific instructions. If you have problems or questions, contact your health care provider. What can I expect after the procedure? After the procedure, it is common to have: Bruising and tenderness at the catheter insertion area. Follow these instructions at home:  Insertion site care Follow instructions from your health care provider about how to take care of your insertion site. Make sure you: Wash your hands with soap and water before you change your bandage (dressing). If soap and water are not available, use hand sanitizer. Remove your dressing as told by your health care provider. In 24 hours Check your insertion site every day for signs of infection. Check for: Redness, swelling, or pain. Pus or a bad smell. Warmth. You may shower 24-48 hours after the procedure. Do not apply powder or lotion to the site.  Activity For 24 hours after the procedure, or as directed by your health care provider: Do not push or pull heavy objects with the affected arm. Do not drive yourself home from the hospital or clinic. You may drive 24 hours after the procedure unless your health care provider tells you not to. Do not lift anything that is heavier than 10 lb (4.5 kg), or the limit that you are told, until your health care provider says that it is safe.  For 24 hours

## 2023-11-27 NOTE — Interval H&P Note (Signed)
 History and Physical Interval Note:  11/27/2023 6:53 AM  Zachary Andrews  has presented today for surgery, with the diagnosis of AV stenosis.  The various methods of treatment have been discussed with the patient and family. After consideration of risks, benefits and other options for treatment, the patient has consented to  Procedure(s): RIGHT/LEFT HEART CATH AND CORONARY ANGIOGRAPHY (N/A) as a surgical intervention.  The patient's history has been reviewed, patient examined, no change in status, stable for surgery.  I have reviewed the patient's chart and labs.  Questions were answered to the patient's satisfaction.     Suhaan Perleberg K [place]

## 2023-11-27 NOTE — Progress Notes (Signed)
 BMET ordered per Dr. Lynnette Caffey

## 2023-11-28 ENCOUNTER — Encounter (HOSPITAL_COMMUNITY): Payer: Self-pay | Admitting: Internal Medicine

## 2023-11-29 NOTE — Progress Notes (Addendum)
 Patient ID: Zachary Andrews MRN: [id number] DOB/AGE: Aug 09, 1955 68 y.o.  Primary Care Physician:Dough, Lamar CROME, MD Primary Cardiologist: Monetta  CC:  Aortic valvular disease management     FOCUSED PROBLEM LIST:   Aortic stenosis AVA 1.08, MG 31, V-max 3.98, EF 60 to 65% TTE August 2025 EKG atrial fibrillation without bundle-branch blocks CAD Mild; coronary angiography October 2025 PAF On Eliquis ; currently on hold due to hematuria T2DM Not on insulin  Hypertension Hyperlipidemia CKD stage II BMI 40/BSA 2.02 December 2023:  Patient consents to use of AI scribe. The patient is a 68 year old male with the above listed medical problems referred for recommendations regarding his aortic valvular disease.  He was seen recently by his primary cardiologist Dr. Monetta with course of shortness of breath.  An echocardiogram demonstrated moderate aortic stenosis.  He is here to discuss further.  Additionally he underwent coronary angiography recently which demonstrated mild nonobstructive disease.  He experiences shortness of breath that is exacerbated by physical activity, such as walking and riding a bicycle, but not while sitting. He previously had to call an ambulance due to his symptoms.  No lightheadedness or blackouts are reported.  He experiences significant back pain when walking, necessitating frequent sitting. This pain, along with shortness of breath, limits his physical activities. He was previously able to walk well after back surgery but has since had worsening symptoms that have limited his mobility.  No swelling in his legs and no issues with breathing while lying flat, as his bed is elevated. His caregiver confirms that his feet are checked nightly and show no significant swelling.  He is currently on Trulicity for diabetes management, which is under control according to his caregiver. He also takes medication for cholesterol management.  No dental issues.         Past  Medical History:  Diagnosis Date   Abdominal aortic atherosclerosis 05/19/2020   05/19/2020: on CT     Acute non-recurrent sinusitis 07/09/2023   07/09/2023     Acute renal insufficiency 09/16/2019   Aortic stenosis, moderate 09/09/2017   Formatting of this note might be different from the original.  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   Carbuncle 08/20/2023   08/20/2023: right axilla     Carotid stenosis, right 09/09/2017   Carpal tunnel syndrome, bilateral 06/01/2015   Central spinal stenosis 12/21/2016   Chronic bilateral low back pain with left-sided sciatica 07/11/2015   Coronary artery disease involving native coronary artery of native heart 12/21/2016   Deviated nasal septum 01/31/2022   01/31/2022, rightward     Elevated LFTs 08/22/2017   ICD-10 cut over   Frequent falls 10/15/2016   H/O spinal stenosis 08/22/2017   Hepatic steatosis 05/19/2020   05/19/2020: on CT     History of cigarette smoking 05/27/2022   Last 2014, 44 pack years     History of pulmonary embolism 09/21/2019   Hypertension 02/06/2015   Hypertensive heart disease 02/06/2015   Moderate LVH     Hypotension 09/16/2019   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   02/07/2022:     Ingrown nail 09/29/2021   Intervertebral disc disorder with radiculopathy of lumbosacral region 10/04/2014   Ischial bursitis 08/03/2019   Lumbar disc disease 08/22/2017   Lumbar radiculopathy 12/25/2015   Lumbar spondylosis 04/29/2016   Lumbar stenosis with neurogenic claudication 12/21/2016   Microalbuminuria 08/22/2017   Mixed hyperlipidemia 03/13/2015   Refusing  statin   Nephrolithiasis 12/21/2016   Neuropathy 08/19/2017   From cervical and lumbar spinal stenosis  And carpal tunnel   Non-recurrent unilateral inguinal hernia without obstruction or gangrene 12/23/2016   2018   Numbness 02/06/2015   Obesity 08/22/2017   Obstructive sleep  apnea on CPAP 04/10/2016   Managed PULM   Occlusion of right internal carotid artery 12/21/2016   >70%   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   Osteoarthritis of shoulder 12/21/2016   Pain around toenail, right foot 09/29/2021   Pain in left knee 07/08/2017   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 2 diabetes mellitus (HCC) 09/13/2021   Polyp of colon 12/21/2016   Primary osteoarthritis of left hip 11/19/2016   Pulmonary embolism (HCC) 09/21/2019   Rib pain on left side 05/19/2020   05/19/2020:     Risk for falls 04/16/2021   04/16/2021 : high risk, is falling, will not use walker; has poor core   strength and foot neuropathy; declined PT, etc; risk discussed     Sensorineural hearing loss (SNHL) of both ears 04/20/2018   Sexual dysfunction 08/22/2017   Spinal cord compression (HCC) 02/06/2015   Stage 2 chronic kidney disease 09/10/2017   Stage 3a chronic kidney disease (HCC) 10/23/2021   10/23/2021: 56, stable, HTN/DM/VASC     Surgical wound present 12/02/2022   12/02/2022 : LSS     Suspected severe acute respiratory syndrome coronavirus 2 (SARS-CoV-2) infection 09/16/2019   Symptoms involving cardiovascular system 01/12/2019   Tinea unguium 12/21/2016   Tinnitus of both ears 04/20/2018   Type 2 diabetes mellitus without complication, without long-term current use of insulin  (HCC) 04/10/2016   Vitamin D deficiency 04/10/2016    Past Surgical History:  Procedure Laterality Date   CARPAL TUNNEL RELEASE Bilateral    CERVICAL SPINE SURGERY     HEMORRHOID SURGERY     LUMBAR SPINE SURGERY     scraped nerves   PERIPHERAL NEUROPLASTY OF FINGER / HAND / ARM Bilateral    transposition median nerve at carpal tunnel   RIGHT HEART CATH AND CORONARY ANGIOGRAPHY N/A 11/27/2023   Procedure: RIGHT HEART CATH AND CORONARY ANGIOGRAPHY;  Surgeon: Zayonna Ayuso K, MD;  Location: [facility];  Service: Cardiovascular;  Laterality: N/A;   TONSILLECTOMY      Family History  Problem  Relation Age of Onset   Heart attack Father    Leukemia Brother    Congestive Heart Failure Mother    Bone cancer Mother     Social History   Socioeconomic History   Marital status: Married    Spouse name: Not on file   Number of children: Not on file   Years of education: Not on file   Highest education level: Not on file  Occupational History   Not on file  Tobacco Use   Smoking status: Former    Current packs/day: 0.00    Types: Cigarettes    Quit date: 11/25/2012    Years since quitting: 11.0    Passive exposure: Past   Smokeless tobacco: Never  Vaping Use   Vaping status: Never Used  Substance and Sexual Activity   Alcohol use: Not Currently   Drug use: Not Currently   Sexual activity: Not on file  Other Topics Concern   Not on file  Social History Narrative   Not on file   Social Drivers of Health   Financial Resource Strain: Not on file  Food Insecurity: Low Risk  (10/15/2023)   Received  from [facility]   Hunger Vital Sign    Within the past 12 months, you worried that your food would run out before you got money to buy more: Never true    Within the past 12 months, the food you bought just didn't last and you didn't have money to get more. : Never true  Transportation Needs: No Transportation Needs (10/15/2023)   Received from [facility]    In the past 12 months, has lack of reliable transportation kept you from medical appointments, meetings, work or from getting things needed for daily living? : No  Physical Activity: Not on file  Stress: Not on file  Social Connections: Unknown (07/06/2021)   Received from [facility]   Social Network    Social Network: Not on file  Intimate Partner Violence: Unknown (05/29/2021)   Received from [facility]   HITS    Physically Hurt: Not on file    Insult or Talk Down To: Not on file    Threaten Physical Harm: Not on file    Scream or Curse: Not on file     Prior to Admission medications    Medication Sig Start Date End Date Taking? Authorizing Provider  amoxicillin  (AMOXIL ) 500 MG capsule Take 4 capsules (2,000 mg total) by mouth daily as needed. Take two hours prior to your dental work. 10/22/23   Monetta Redell PARAS, MD  apixaban  (ELIQUIS ) 5 MG TABS tablet Take 1 tablet (5 mg total) by mouth 2 (two) times daily. Patient not taking: Reported on 11/26/2023 11/14/23   Monetta Redell PARAS, MD  Ascorbic Acid (VITAMIN C) 1000 MG tablet Take 2,000 mg by mouth daily.    [provider]  aspirin  EC 81 MG tablet Take 81 mg by mouth daily. Swallow whole.    [provider]  carboxymethylcellulose 1 % ophthalmic solution Place 1 drop into both eyes daily as needed (Dry eyes).    [provider]  cholecalciferol (VITAMIN D3) 25 MCG (1000 UNIT) tablet Take 1,000 Units by mouth daily.    [provider]  Dulaglutide (TRULICITY) 4.5 MG/0.5ML SOPN Inject 4.5 mg into the skin every 7 (seven) days. 05/27/22   [provider]  Evolocumab  (REPATHA  SURECLICK) 140 MG/ML SOAJ Inject 1 pen into the skin every 14 (fourteen) days. 06/15/19   Monetta Redell PARAS, MD  fluticasone (FLONASE) 50 MCG/ACT nasal spray 1 spray by Both Nostrils route daily as needed for Rhinitis.    [provider]  furosemide  (LASIX ) 20 MG tablet Take 1 tablet (20 mg total) by mouth daily. 11/27/23   Blu Mcglaun K, MD  lisinopril (ZESTRIL) 20 MG tablet Take 20 mg by mouth daily. 10/15/23   [provider]  nitroGLYCERIN  (NITROSTAT ) 0.4 MG SL tablet Place 1 tablet (0.4 mg total) under the tongue every 5 (five) minutes as needed for chest pain. 10/22/23   Monetta Redell PARAS, MD  pregabalin (LYRICA) 50 MG capsule Take 50 mg by mouth at bedtime. 10/06/23   [provider]  traMADol  (ULTRAM ) 50 MG tablet Take 50 mg by mouth every 6 (six) hours as needed for moderate pain (pain score 4-6) or severe pain (pain score 7-10). 05/30/23   [provider]    Allergies  Allergen Reactions    Bee Venom Anaphylaxis   Atorvastatin Calcium Other (See Comments)    Muscle aches   Ezetimibe Other (See Comments)    Myalgias     Dapagliflozin     Other  reaction(s): GI Upset (intolerance)   Metformin Other (See Comments)    Other reaction(s): Myalgias (intolerance) Tried on and off with same sx   Pravastatin Other (See Comments)    Muscle aches   Rosuvastatin Other (See Comments)    Muscle aches   Atorvastatin Other (See Comments)    muscle aches; myalgia   Gabapentin Other (See Comments)    confusion    REVIEW OF SYSTEMS:  General: no fevers/chills/night sweats Eyes: no blurry vision, diplopia, or amaurosis ENT: no sore throat or hearing loss Resp: no cough, wheezing, or hemoptysis CV: no edema or palpitations GI: no abdominal pain, nausea, vomiting, diarrhea, or constipation GU: no dysuria, frequency, or hematuria Skin: no rash Neuro: no headache, numbness, tingling, or weakness of extremities Musculoskeletal: no joint pain or swelling Heme: no bleeding, DVT, or easy bruising Endo: no polydipsia or polyuria  BP 117/77   Pulse 69   Ht 6' 2 (1.88 m)   Wt (!) 307 lb 9.6 oz (139.5 kg)   SpO2 97%   BMI 39.49 kg/m   PHYSICAL EXAM: GEN:  AO x 3 in no acute distress HEENT: normal Dentition: Normal Neck: JVP normal. +2 carotid upstrokes without bruits. No thyromegaly. Lungs: equal expansion, clear bilaterally CV: Apex is discrete and nondisplaced, RRR with 2/6 SEM Abd: soft, non-tender, non-distended; no bruit; positive bowel sounds Ext: trace edema, no ecchymoses, or cyanosis Vascular: 2+ femoral pulses, 2+ radial pulses       Skin: warm and dry without rash Neuro: CN II-XII grossly intact; motor and sensory grossly intact    DATA AND STUDIES:  EKG: 2025 atrial fibrillation without bundle-branch blocks  EKG Interpretation Date/Time:  Tuesday December 02 2023 10:31:33 EDT Ventricular Rate:  64 PR Interval:    QRS Duration:  80 QT Interval:  372 QTC  Calculation: 383 R Axis:   76  Text Interpretation: Atrial fibrillation No previous ECGs available Confirmed by Wendel Haws ([id number]) on 12/02/2023 11:44:00 AM        CARDIAC STUDIES: Refer to CV Procedures and Imaging Tabs  11/14/2023: BUN 22; Creatinine, Ser 1.26 11/27/2023: Hemoglobin 13.9; Platelets 192; Potassium 4.2; Sodium 142   STS RISK CALCULATOR: Pending  NYHA CLASS: 2  CCS CLASS: 1  EUROSCORE: 0.95%  ASSESSMENT AND PLAN:   1. Nonrheumatic aortic valve stenosis   2. Mild CAD   3. PAF (paroxysmal atrial fibrillation) (HCC)   4. Secondary hypercoagulable state   5. Type 2 diabetes mellitus without complication, without long-term current use of insulin  (HCC)   6. Hypertension associated with diabetes (HCC)   7. Hyperlipidemia associated with type 2 diabetes mellitus (HCC)   8. CKD stage 2 due to type 2 diabetes mellitus (HCC)   9. BMI 40.0-44.9, adult (HCC)     AS: Patient has developed NYHA class II symptoms of dyspnea.  Patient is a candidate for PROGRESS CAP.  I will have research personnel speak to the patient about inclusion in the research program.  He seems to be interested.  Further recommendations will be informed by the results of his CT scan.  We will refer the patient for a cardiothoracic surgical opinion as well. Mild CAD: Continue aspirin  81 mg, continue Repatha  140mg  q2weeks PAF: Eliquis  5 mg twice daily is on hold due to hematuria Hypercoagulable state: Eliquis  5 mg twice daily is on hold due to hematuria T2DM: Continue aspirin  81 mg, lisinopril 20 mg daily, Repatha  140 mg every 2 weeks. HTN:  Continue lisinopril 20mg  daily.  BP  is well-controlled today. Hyperlipidemia: Continue Repatha  140mg  q2weeks CKD stage II: Continue lisinopril 20 mg; consider SGLT2 inhibitor in the future.   Elevated BMI:  Continue Trulicity 4.5mg  qweekly    I have personally reviewed the patients imaging data as summarized above.  I have reviewed the natural history of  aortic stenosis with the patient and family members who are present today. We have discussed the limitations of medical therapy and the poor prognosis associated with symptomatic aortic stenosis. We have also reviewed potential treatment options, including palliative medical therapy, conventional surgical aortic valve replacement, and transcatheter aortic valve replacement. We discussed treatment options in the context of this patient's specific comorbid medical conditions.   All of the patient's questions were answered today. Will make further recommendations based on the results of studies outlined above.   I spent 45 minutes reviewing all clinical data during and prior to this visit including all relevant imaging studies, laboratories, clinical information from other health systems and prior notes from both Cardiology and other specialties, interviewing the patient, conducting a complete physical examination, and coordinating care in order to formulate a comprehensive and personalized evaluation and treatment plan.   Blen Ransome K Mayson Mcneish, MD  12/02/2023 11:45 AM    [facility] [address] [place], [place], [place]  [address] Phone: [phone]; Fax: [phone]

## 2023-12-02 ENCOUNTER — Encounter: Payer: Self-pay | Admitting: Internal Medicine

## 2023-12-02 ENCOUNTER — Encounter: Admitting: *Deleted

## 2023-12-02 ENCOUNTER — Ambulatory Visit: Attending: Internal Medicine | Admitting: Internal Medicine

## 2023-12-02 ENCOUNTER — Other Ambulatory Visit: Payer: Self-pay

## 2023-12-02 VITALS — BP 117/77 | HR 69 | Ht 74.0 in | Wt 307.6 lb

## 2023-12-02 DIAGNOSIS — D6869 Other thrombophilia: Secondary | ICD-10-CM

## 2023-12-02 DIAGNOSIS — E1169 Type 2 diabetes mellitus with other specified complication: Secondary | ICD-10-CM

## 2023-12-02 DIAGNOSIS — Z6841 Body Mass Index (BMI) 40.0 and over, adult: Secondary | ICD-10-CM

## 2023-12-02 DIAGNOSIS — E785 Hyperlipidemia, unspecified: Secondary | ICD-10-CM

## 2023-12-02 DIAGNOSIS — N182 Chronic kidney disease, stage 2 (mild): Secondary | ICD-10-CM

## 2023-12-02 DIAGNOSIS — E1159 Type 2 diabetes mellitus with other circulatory complications: Secondary | ICD-10-CM

## 2023-12-02 DIAGNOSIS — Z006 Encounter for examination for normal comparison and control in clinical research program: Secondary | ICD-10-CM

## 2023-12-02 DIAGNOSIS — I350 Nonrheumatic aortic (valve) stenosis: Secondary | ICD-10-CM

## 2023-12-02 DIAGNOSIS — E119 Type 2 diabetes mellitus without complications: Secondary | ICD-10-CM

## 2023-12-02 DIAGNOSIS — N1832 Chronic kidney disease, stage 3b: Secondary | ICD-10-CM

## 2023-12-02 DIAGNOSIS — I480 Paroxysmal atrial fibrillation: Secondary | ICD-10-CM | POA: Diagnosis not present

## 2023-12-02 DIAGNOSIS — I152 Hypertension secondary to endocrine disorders: Secondary | ICD-10-CM

## 2023-12-02 DIAGNOSIS — E1122 Type 2 diabetes mellitus with diabetic chronic kidney disease: Secondary | ICD-10-CM

## 2023-12-02 DIAGNOSIS — I10 Essential (primary) hypertension: Secondary | ICD-10-CM

## 2023-12-02 DIAGNOSIS — I2510 Atherosclerotic heart disease of native coronary artery without angina pectoris: Secondary | ICD-10-CM

## 2023-12-02 NOTE — Research (Signed)
 The Progress Trial - Continued Access Informed Consent   Progress CAP Informed Consent      Subject Name: Zachary Andrews  Subject met inclusion and exclusion criteria.  The informed consent form, study requirements and expectations were reviewed with the subject and questions and concerns were addressed prior to the signing of the consent form. The shared decision making tool was reviewed with the subject. The subject verbalized understanding of the trial requirements.  The subject agreed to participate in the Progress Cap trial and signed the informed consent at 1015 on 12/02/2023.  The informed consent was obtained prior to performance of any protocol-specific procedures for the subject.  A copy of the signed informed consent was given to the subject and a copy was placed in the subject's medical record.   Zachary Andrews    INCLUSION:  [x]  68 years of age or older at time of randomization  [x]   Moderate AS per one of the following as assessed by the Echo Core Lab:    A: Aortic valve area (AVA) / Aortic valve area index (AVAi):   AVA 1.0 - 1.5 cm2; OR   AVA < 1.0 with AVAi > 0.6 cm2/m2 if BMI < 30 kg/m2; OR   AVA < 1.0 with AVAi > 0.5 cm2/m2 if BMI >= 30 kg/m2, OR   AVA > 1.5 with AVAi < 0.9 cm2/m2 if BMI < 30 kg/m2; OR   AVA > 1.5 with AVAi < 0.8 cm2/m2 if BMI >= 30 kg/m2   AND  Peak velocity 3.0 - < 4.0 m/s OR mean gradient 20 - < 40 mmHg  OR  B: Subjects who only meet one of the criteria in 2A on resting echo due to low flow state (LVEF < 50% or SVI < 35 mL/m2) are eligible if both criteria are met following dobutamine stress echo (DSE). Note: Subjects with inconclusive results from DSE are eligible upon approval by the Case Review Board.  OR  C: Subjects who only meet one of the criteria in 2A on resting echo and have normal flow (LVEF >= 50% and SVi >= 35 mL/m2) are eligible if non-contrast CT calcium score is < 1200 AU for women or < 2000 AU for men.  [x]   Subject meets  at least one of the following criteria:   Valve-related symptoms including [place]  Heart Association functional class >= II, dyspnea, angina, dizziness, pre-syncope, or syncope deemed to be related to AS   LVEF < 60% as assessed by the Echo Core Lab   Diastolic dysfunction (>= Grade 2) as assessed by the Echo Core Lab per American Society of Echocardiography Guidelines1 (i.e., E/e' > 14, left atrium volume index > 34 mL/m2, or tricuspid regurgitation velocity > 2.8 m/sec)   Stroke volume index < 35 mL/m2 as assessed by the Echo Core Lab  Persistent atrial fibrillation or any episode of paroxysmal atrial fibrillation within 6 months prior to randomization   NT-Pro BNP > 3x normal   Elevated calcium score as assessed by Computed Tomography (CT) Core Lab (> 1200 AU for male and > 2000 AU for male) (only applicable for subjects eligible per 2A or 2B above)  [x]  The subject or subject's legal representative has been informed of the nature of the study, agrees to its provisions, and has provided written informed consent   EXCLUSION []  Native aortic annulus size unsuitable for the THV based on computed tomography angiography (CTA) analysis  []  Anatomical characteristics that would preclude safe placement  of the introducer sheath or safe passage of the delivery system  []  Aortic valve is unicuspid or non-calcified  []  Bicuspid aortic valve with an aneurysmal ascending aorta > 4.5 cm or severe raphe/leaflet calcification as assessed by CT Core Lab  []  Pre-existing mechanical or bioprosthetic aortic valve  []  Severe aortic regurgitation (>3+)  []  Prior balloon aortic valvuloplasty (BAV) to treat severe AS  []  LVEF < 20% as assessed by the Echo Core Lab  []  Left ventricular outflow tract (LVOT) calcification that would increase the risk of annular rupture or significant paravalvular leak (PVL) post-TAVR  []  Cardiac imaging evidence of intracardiac mass, thrombus, or vegetation  []  Coronary or aortic  valve anatomy that increases the risk of coronary artery obstruction post-TAVR  []  Myocardial infarction within 30 days prior to randomization  []  Hypertrophic cardiomyopathy with sub-valvular obstruction or restrictive cardiomyopathy  []  Any concomitant valvular disease requiring surgical or transcatheter intervention.  []  Significant untreated coronary artery disease requiring revascularization  []  Any surgical or transcatheter procedure within 30 days prior to randomization  []  Active bacterial endocarditis within 180 days prior to randomization  []  Stroke or transient ischemic attack (TIA) within 90 days prior to randomization  []  Symptomatic carotid or vertebral artery disease or successful treatment of carotid stenosis within 30 days prior to randomization  []  Severe chronic obstructive pulmonary disease (COPD, Forced Ejection Volume 1 [FEV1] < 50% predicted) or currently on home oxygen  []  Hemodynamic or respiratory instability requiring inotropic or mechanical support within 30 days prior to randomization  []  Liver disease (cirrhosis of the liver [Child-Pugh class B or C])  []  Renal insufficiency (estimated Glomerular Filtration Rate [eGFR] < 30 mL/min/1.73 m2) and/or renal replacement therapy  []  Significant frailty as determined by the Heart Team  []  Leukopenia (White blood cells < 3000 cells/mL), anemia (Hemoglobin < 9 g/dL), thrombocytopenia (platelets < 50,000 cells/mL)  []  Inability to tolerate or condition precluding treatment with antithrombotic therapy (including single antiplatelet therapy)  []  Hypercoagulable state or other condition that increases risk of thrombosis  []  Absolute contraindications or allergy to iodinated contrast that cannot be adequately treated with pre-medication  []  Subject refuses blood products  []  Body mass index (BMI) > 50 kg/m2  []  Estimated life expectancy < 24 months  []  Active SARS-CoV-2 infection (Coronavirus-19 [COVID-19]) or previously diagnosed  with COVID-19 with sequelae that could confound endpoint assessments (as assessed by the Case Review Board)  []  Participating in another drug or device study that has not reached its primary endpoint  []  Subject considered to be part of a vulnerable population    Progress Screening Visit    Medical History: Past Medical History:  Diagnosis Date   Abdominal aortic atherosclerosis 05/19/2020    05/19/2020: on CT     Acute non-recurrent sinusitis 07/09/2023    07/09/2023     Acute renal insufficiency 09/16/2019   Aortic stenosis, moderate 09/09/2017    Formatting of this note might be different from the original.  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   Carbuncle 08/20/2023    08/20/2023: right axilla     Carotid stenosis, right 09/09/2017   Carpal tunnel syndrome, bilateral 06/01/2015   Central spinal stenosis 12/21/2016   Chronic bilateral low back pain with left-sided sciatica 07/11/2015   Coronary artery disease involving native coronary artery of native heart 12/21/2016   Deviated nasal septum 01/31/2022  01/31/2022, rightward     Elevated LFTs 08/22/2017    ICD-10 cut over   Frequent falls 10/15/2016   H/O spinal stenosis 08/22/2017   Hepatic steatosis 05/19/2020    05/19/2020: on CT     History of cigarette smoking 05/27/2022    Last 2014, 44 pack years     History of pulmonary embolism 09/21/2019   Hypertension 02/06/2015   Hypertensive heart disease 02/06/2015    Moderate LVH     Hypotension 09/16/2019   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    02/07/2022:     Ingrown nail 09/29/2021   Intervertebral disc disorder with radiculopathy of lumbosacral region 10/04/2014   Ischial bursitis 08/03/2019   Lumbar disc disease 08/22/2017   Lumbar radiculopathy 12/25/2015   Lumbar spondylosis 04/29/2016   Lumbar stenosis with neurogenic claudication 12/21/2016   Microalbuminuria 08/22/2017    Mixed hyperlipidemia 03/13/2015    Refusing statin   Nephrolithiasis 12/21/2016   Neuropathy 08/19/2017    From cervical and lumbar spinal stenosis  And carpal tunnel   Non-recurrent unilateral inguinal hernia without obstruction or gangrene 12/23/2016    2018   Numbness 02/06/2015   Obesity 08/22/2017   Obstructive sleep apnea on CPAP 04/10/2016    Managed PULM   Occlusion of right internal carotid artery 12/21/2016    >70%   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   Osteoarthritis of shoulder 12/21/2016   Pain around toenail, right foot 09/29/2021   Pain in left knee 07/08/2017   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 2 diabetes mellitus (HCC) 09/13/2021   Polyp of colon 12/21/2016   Primary osteoarthritis of left hip 11/19/2016   Pulmonary embolism (HCC) 09/21/2019   Rib pain on left side 05/19/2020    05/19/2020:     Risk for falls 04/16/2021    04/16/2021 : high risk, is falling, will not use walker; has poor core   strength and foot neuropathy; declined PT, etc; risk discussed     Sensorineural hearing loss (SNHL) of both ears 04/20/2018   Sexual dysfunction 08/22/2017   Spinal cord compression (HCC) 02/06/2015   Stage 2 chronic kidney disease 09/10/2017   Stage 3a chronic kidney disease (HCC) 10/23/2021    10/23/2021: 56, stable, HTN/DM/VASC     Surgical wound present 12/02/2022    12/02/2022 : LSS     Suspected severe acute respiratory syndrome coronavirus 2 (SARS-CoV-2) infection 09/16/2019   Symptoms involving cardiovascular system 01/12/2019   Tinea unguium 12/21/2016   Tinnitus of both ears 04/20/2018   Type 2 diabetes mellitus without complication, without long-term current use of insulin (HCC) 04/10/2016   Vitamin Andrews deficiency 04/10/2016      Medications: See below   Society of Thoracic Surgeons Score: 1.92    European System for Cardiac Operative Risk Evaluation (EuroSCORE) ll:    Surgical Risk assessed by Heart Team:  [x]  - Low []  -  Intermediate []  - High []  - Extreme   [place]  Heart Association  (NYHA) II   Canadian Cardiovascular Society Score (CCS) angina grade:     Transthoracic echocardiogram (TTE), including DSE as applicable: 10/23/2023    12- lead electrocardiogram:     Cardiac CTA with 3D within 1 year: 12/10/2023     PFT: N/A   NIHSS  MRS Completed 01/06/2024  Labs: see labs   KCCQ: See worksheet   [id number]: See worksheet   SF-36:  See worksheet        Current Outpatient Medications:    aspirin  EC 81 MG  tablet, Take 81 mg by mouth daily. Swallow whole., Disp: , Rfl:    carboxymethylcellulose 1 % ophthalmic solution, Place 1 drop into both eyes daily as needed (Dry eyes)., Disp: , Rfl:    Dulaglutide (TRULICITY) 4.5 MG/0.5ML SOPN, Inject 4.5 mg into the skin every 7 (seven) days., Disp: , Rfl:    Evolocumab  (REPATHA  SURECLICK) 140 MG/ML SOAJ, Inject 1 pen into the skin every 14 (fourteen) days., Disp: 2 pen, Rfl: 6   fluticasone (FLONASE) 50 MCG/ACT nasal spray, 1 spray by Both Nostrils route daily as needed for Rhinitis., Disp: , Rfl:    furosemide  (LASIX ) 20 MG tablet, Take 1 tablet (20 mg total) by mouth daily. (Patient taking differently: Take 20 mg by mouth every other day.), Disp: 90 tablet, Rfl: 3   lisinopril (ZESTRIL) 20 MG tablet, Take 20 mg by mouth daily., Disp: , Rfl:    nitroGLYCERIN  (NITROSTAT ) 0.4 MG SL tablet, Place 1 tablet (0.4 mg total) under the tongue every 5 (five) minutes as needed for chest pain., Disp: 25 tablet, Rfl: 1   traMADol (ULTRAM) 50 MG tablet, Take 50 mg by mouth every 6 (six) hours as needed for moderate pain (pain score 4-6) or severe pain (pain score 7-10)., Disp: , Rfl:    amoxicillin  (AMOXIL ) 500 MG capsule, Take 4 capsules (2,000 mg total) by mouth daily as needed. Take two hours prior to your dental work., Disp: 20 capsule, Rfl: 1   apixaban  (ELIQUIS ) 5 MG TABS tablet, Take 1 tablet (5 mg total) by mouth 2 (two) times daily.  (Patient not taking: Reported on 12/02/2023), Disp: 180 tablet, Rfl: 3   Ascorbic Acid (VITAMIN C) 1000 MG tablet, Take 2,000 mg by mouth daily. (Patient not taking: Reported on 12/02/2023), Disp: , Rfl:    cholecalciferol (VITAMIN D3) 25 MCG (1000 UNIT) tablet, Take 1,000 Units by mouth daily. (Patient not taking: Reported on 12/02/2023), Disp: , Rfl:    pregabalin (LYRICA) 50 MG capsule, Take 50 mg by mouth at bedtime. (Patient not taking: Reported on 12/02/2023), Disp: , Rfl: '

## 2023-12-02 NOTE — Patient Instructions (Signed)
 Medication Instructions:  Your physician recommends that you continue on your current medications as directed. Please refer to the Current Medication list given to you today.  *If you need a refill on your cardiac medications before your next appointment, please call your pharmacy*  Lab Work: NONE  If you have labs (blood work) drawn today and your tests are completely normal, you will receive your results only by: MyChart Message (if you have MyChart) OR A paper copy in the mail If you have any lab test that is abnormal or we need to change your treatment, we will call you to review the results.  Testing/Procedures: NONE  Follow-Up:To be determined  At [facility], you and your health needs are our priority.  As part of our continuing mission to provide you with exceptional heart care, our providers are all part of one team.  This team includes your primary Cardiologist (physician) and Advanced Practice Providers or APPs (Physician Assistants and Nurse Practitioners) who all work together to provide you with the care you need, when you need it.   Provider:   Lurena Red, MD

## 2023-12-03 ENCOUNTER — Encounter: Payer: Self-pay | Admitting: Cardiology

## 2023-12-03 ENCOUNTER — Ambulatory Visit: Payer: Self-pay

## 2023-12-03 DIAGNOSIS — I480 Paroxysmal atrial fibrillation: Secondary | ICD-10-CM

## 2023-12-03 LAB — COMPREHENSIVE METABOLIC PANEL WITH GFR
ALT: 24 IU/L (ref 0–44)
AST: 28 IU/L (ref 0–40)
Albumin: 4.1 g/dL (ref 3.9–4.9)
Alkaline Phosphatase: 81 IU/L (ref 47–123)
BUN/Creatinine Ratio: 16 (ref 10–24)
BUN: 20 mg/dL (ref 8–27)
Bilirubin Total: 0.5 mg/dL (ref 0.0–1.2)
CO2: 23 mmol/L (ref 20–29)
Calcium: 9.4 mg/dL (ref 8.6–10.2)
Chloride: 102 mmol/L (ref 96–106)
Creatinine, Ser: 1.29 mg/dL — ABNORMAL HIGH (ref 0.76–1.27)
Globulin, Total: 2.7 g/dL (ref 1.5–4.5)
Glucose: 92 mg/dL (ref 70–99)
Potassium: 4.2 mmol/L (ref 3.5–5.2)
Sodium: 141 mmol/L (ref 134–144)
Total Protein: 6.8 g/dL (ref 6.0–8.5)
eGFR: 61 mL/min/1.73 (ref >59)

## 2023-12-03 LAB — CBC
Hematocrit: 45.4 % (ref 37.5–51.0)
Hemoglobin: 14.8 g/dL (ref 13.0–17.7)
MCH: 30 pg (ref 26.6–33.0)
MCHC: 32.6 g/dL (ref 31.5–35.7)
MCV: 92 fL (ref 79–97)
Platelets: 193 x10E3/uL (ref 150–450)
RBC: 4.94 x10E6/uL (ref 4.14–5.80)
RDW: 13 % (ref 11.6–15.4)
WBC: 7.6 x10E3/uL (ref 3.4–10.8)

## 2023-12-03 LAB — PRO B NATRIURETIC PEPTIDE: NT-Pro BNP: 530 pg/mL — ABNORMAL HIGH (ref 0–376)

## 2023-12-10 ENCOUNTER — Ambulatory Visit (HOSPITAL_COMMUNITY)
Admission: RE | Admit: 2023-12-10 | Discharge: 2023-12-10 | Disposition: A | Discharge status: Home / Self Care | Source: Ambulatory Visit | Attending: Cardiovascular Disease | Admitting: Cardiovascular Disease

## 2023-12-10 DIAGNOSIS — I350 Nonrheumatic aortic (valve) stenosis: Secondary | ICD-10-CM

## 2023-12-10 DIAGNOSIS — Z006 Encounter for examination for normal comparison and control in clinical research program: Secondary | ICD-10-CM

## 2023-12-10 MED ORDER — IOHEXOL 350 MG/ML SOLN
100.0000 mL | Freq: Once | INTRAVENOUS | Status: AC | PRN
  Administered 2023-12-10: 100 mL via INTRAVENOUS

## 2023-12-10 NOTE — Progress Notes (Signed)
 Procedure Type: Isolated AVR Perioperative Outcome Estimate % Operative Mortality 1.92% Morbidity & Mortality 11.2% Stroke 1.65% Renal Failure 3% Reoperation 3.03% Prolonged Ventilation 5.51% Deep Sternal Wound Infection 0.114% Long Hospital Stay (>14 days) 4.12% Short Hospital Stay (<6 days)* 35.9%

## 2023-12-11 ENCOUNTER — Ambulatory Visit: Payer: Self-pay | Admitting: Internal Medicine

## 2023-12-18 NOTE — H&P (View-Only) (Signed)
 [address]       [place] [address]             [phone]        Marquez Ceesay [facility] Medical Record #[id number] Date of Birth: [birth year]  Referring: Wendel Lurena POUR, MD Primary Care: Ofilia Lamar CROME, MD Primary Cardiologist:None  Chief Complaint:   No chief complaint on file.   History of Present Illness:     Sostenes Kauffmann is a 68 y.o. male presents for surgical evaluation of moderate to severe aortic stenosis.  He is currently being evaluated int the CAP trial.  He occasionally has some shortness of breath, but denies any chest pain or dizziness.  He has previously undergone a left heart cath and there was no significant disease.    Past Medical History:  Diagnosis Date   Abdominal aortic atherosclerosis 05/19/2020   05/19/2020: on CT     Acute non-recurrent sinusitis 07/09/2023   07/09/2023     Acute renal insufficiency 09/16/2019   Aortic stenosis, moderate 09/09/2017   Formatting of this note might be different from the original.  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   Carbuncle 08/20/2023   08/20/2023: right axilla     Carotid stenosis, right 09/09/2017   Carpal tunnel syndrome, bilateral 06/01/2015   Central spinal stenosis 12/21/2016   Chronic bilateral low back pain with left-sided sciatica 07/11/2015   Coronary artery disease involving native coronary artery of native heart 12/21/2016   Deviated nasal septum 01/31/2022   01/31/2022, rightward     Elevated LFTs 08/22/2017   ICD-10 cut over   Frequent falls 10/15/2016   H/O spinal stenosis 08/22/2017   Hepatic steatosis 05/19/2020   05/19/2020: on CT     History of cigarette smoking 05/27/2022   Last 2014, 44 pack years     History of pulmonary embolism 09/21/2019   Hypertension 02/06/2015   Hypertensive heart disease 02/06/2015   Moderate LVH     Hypotension 09/16/2019   Incisive canal cyst  02/07/2022   02/07/2022:     Ingrown nail 09/29/2021   Intervertebral disc disorder with radiculopathy of lumbosacral region 10/04/2014   Ischial bursitis 08/03/2019   Lumbar disc disease 08/22/2017   Lumbar radiculopathy 12/25/2015   Lumbar spondylosis 04/29/2016   Lumbar stenosis with neurogenic claudication 12/21/2016   Microalbuminuria 08/22/2017   Mixed hyperlipidemia 03/13/2015   Refusing statin   Nephrolithiasis 12/21/2016   Neuropathy 08/19/2017   From cervical and lumbar spinal stenosis  And carpal tunnel   Non-recurrent unilateral inguinal hernia without obstruction or gangrene 12/23/2016   2018   Numbness 02/06/2015   Obesity 08/22/2017   Obstructive sleep apnea on CPAP 04/10/2016   Managed PULM   Occlusion of right internal carotid artery 12/21/2016   >70%   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   Osteoarthritis of shoulder 12/21/2016   Pain around toenail, right foot 09/29/2021   Pain in left knee 07/08/2017   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 2 diabetes mellitus (HCC) 09/13/2021   Polyp of colon 12/21/2016   Primary osteoarthritis of left hip 11/19/2016   Pulmonary embolism (HCC) 09/21/2019   Rib pain on left side 05/19/2020   05/19/2020:     Risk for falls 04/16/2021   04/16/2021 : high risk, is falling, will not use walker; has poor core   strength and foot neuropathy; declined PT,  etc; risk discussed     Sensorineural hearing loss (SNHL) of both ears 04/20/2018   Sexual dysfunction 08/22/2017   Spinal cord compression (HCC) 02/06/2015   Stage 2 chronic kidney disease 09/10/2017   Stage 3a chronic kidney disease (HCC) 10/23/2021   10/23/2021: 56, stable, HTN/DM/VASC     Surgical wound present 12/02/2022   12/02/2022 : LSS     Suspected severe acute respiratory syndrome coronavirus 2 (SARS-CoV-2) infection 09/16/2019   Symptoms involving cardiovascular system 01/12/2019   Tinea unguium 12/21/2016   Tinnitus of both ears 04/20/2018   Type 2  diabetes mellitus without complication, without long-term current use of insulin (HCC) 04/10/2016   Vitamin D deficiency 04/10/2016    Past Surgical History:  Procedure Laterality Date   CARPAL TUNNEL RELEASE Bilateral    CERVICAL SPINE SURGERY     HEMORRHOID SURGERY     LUMBAR SPINE SURGERY     scraped nerves   PERIPHERAL NEUROPLASTY OF FINGER / HAND / ARM Bilateral    transposition median nerve at carpal tunnel   RIGHT HEART CATH AND CORONARY ANGIOGRAPHY N/A 11/27/2023   Procedure: RIGHT HEART CATH AND CORONARY ANGIOGRAPHY;  Surgeon: Thukkani, Arun K, MD;  Location: [facility];  Service: Cardiovascular;  Laterality: N/A;   TONSILLECTOMY      Social History:  Social History   Tobacco Use  Smoking Status Former   Current packs/day: 0.00   Types: Cigarettes   Quit date: 11/25/2012   Years since quitting: 11.0   Passive exposure: Past  Smokeless Tobacco Never    Social History   Substance and Sexual Activity  Alcohol Use Not Currently     Allergies  Allergen Reactions   Bee Venom Anaphylaxis   Atorvastatin Calcium Other (See Comments)    Muscle aches   Ezetimibe Other (See Comments)    Myalgias     Dapagliflozin     Other reaction(s): GI Upset (intolerance)   Metformin Other (See Comments)    Other reaction(s): Myalgias (intolerance) Tried on and off with same sx   Pravastatin Other (See Comments)    Muscle aches   Rosuvastatin Other (See Comments)    Muscle aches   Atorvastatin Other (See Comments)    muscle aches; myalgia   Gabapentin Other (See Comments)    confusion      Current Outpatient Medications  Medication Sig Dispense Refill   amoxicillin  (AMOXIL ) 500 MG capsule Take 4 capsules (2,000 mg total) by mouth daily as needed. Take two hours prior to your dental work. 20 capsule 1   Ascorbic Acid (VITAMIN C) 1000 MG tablet Take 2,000 mg by mouth daily.     aspirin  EC 81 MG tablet Take 81 mg by mouth daily. Swallow whole. (Patient taking  differently: Take 81 mg by mouth in the morning and at bedtime. Swallow whole.)     carboxymethylcellulose 1 % ophthalmic solution Place 1 drop into both eyes daily as needed (Dry eyes).     Dulaglutide (TRULICITY) 4.5 MG/0.5ML SOPN Inject 4.5 mg into the skin every 7 (seven) days.     Evolocumab  (REPATHA  SURECLICK) 140 MG/ML SOAJ Inject 1 pen into the skin every 14 (fourteen) days. 2 pen 6   fluticasone (FLONASE) 50 MCG/ACT nasal spray 1 spray by Both Nostrils route daily as needed for Rhinitis.     furosemide  (LASIX ) 20 MG tablet Take 1 tablet (20 mg total) by mouth daily. (Patient taking differently: Take 20 mg by mouth every other day.) 90 tablet 3  lisinopril (ZESTRIL) 20 MG tablet Take 20 mg by mouth daily.     nitroGLYCERIN  (NITROSTAT ) 0.4 MG SL tablet Place 1 tablet (0.4 mg total) under the tongue every 5 (five) minutes as needed for chest pain. 25 tablet 1   pregabalin (LYRICA) 50 MG capsule Take 50 mg by mouth at bedtime.     traMADol (ULTRAM) 50 MG tablet Take 50 mg by mouth every 6 (six) hours as needed for moderate pain (pain score 4-6) or severe pain (pain score 7-10).     apixaban  (ELIQUIS ) 5 MG TABS tablet Take 1 tablet (5 mg total) by mouth 2 (two) times daily. (Patient not taking: Reported on 12/02/2023) 180 tablet 3   No current facility-administered medications for this visit.    (Not in a hospital admission)   Family History  Problem Relation Age of Onset   Heart attack Father    Leukemia Brother    Congestive Heart Failure Mother    Bone cancer Mother      Review of Systems:   Review of Systems  Constitutional: Negative.   Respiratory:  Positive for shortness of breath.   Cardiovascular:  Negative for chest pain.  Neurological:  Negative for dizziness.      Physical Exam: BP 115/71   Pulse 64   Resp 18   Ht 6' 2 (1.88 m)   Wt (!) 310 lb (140.6 kg)   SpO2 95%   BMI 39.80 kg/m  Physical Exam Constitutional:      General: He is not in acute  distress.    Appearance: He is not ill-appearing.  HENT:     Head: Normocephalic and atraumatic.  Eyes:     Extraocular Movements: Extraocular movements intact.  Cardiovascular:     Rate and Rhythm: Normal rate.  Pulmonary:     Effort: Pulmonary effort is normal. No respiratory distress.  Abdominal:     General: Abdomen is flat. There is no distension.  Musculoskeletal:        General: Normal range of motion.     Cervical back: Normal range of motion.  Skin:    General: Skin is warm and dry.  Neurological:     General: No focal deficit present.     Mental Status: He is alert and oriented to person, place, and time.       Cardiac Studies & Procedures   ______________________________________________________________________________________________ CARDIAC CATHETERIZATION  CARDIAC CATHETERIZATION 11/27/2023  Conclusion   1st Diag lesion is 20% stenosed.  1.  Minimal, nonobstructive coronary artery disease. 2.  Fick cardiac output of 9.9 L/min and Fick cardiac index of 3.8 L/min/m with the following hemodynamics: Right atrial pressure mean of 15 mmHg Right ventricular pressure 37/8 with end-diastolic pressure of 16 mmHg Wedge pressure mean of 23 mmHg V waves to 28 mmHg PA pressure of 32/26 with a mean of 29 mmHg PVR of 0.6 Woods units PA pulsatility necks of 0.4 3.  Capacious iliofemoral vessels bilaterally.  Recommendation: Given elevated filling pressures and low PA pulsatility index will intensify Lasix  to 20 daily and check BMP in 1 week.  Will arrange outpatient referral to discuss management of aortic stenosis.  Findings Coronary Findings Diagnostic  Dominance: Right  Left Anterior Descending The vessel exhibits minimal luminal irregularities.  First Diagonal Branch 1st Diag lesion is 20% stenosed.  Left Circumflex The vessel exhibits minimal luminal irregularities.  Right Coronary Artery The vessel exhibits minimal luminal  irregularities.  Intervention  No interventions have been documented.   STRESS TESTS  MYOCARDIAL PERFUSION IMAGING 10/13/2019  Interpretation Summary  The left ventricular ejection fraction is normal (55-65%).  Nuclear stress EF: 63%.  There was no ST segment deviation noted during stress.  This is a low risk study.   ECHOCARDIOGRAM  ECHOCARDIOGRAM COMPLETE 10/23/2023  Narrative ECHOCARDIOGRAM REPORT    Patient Name:   Harald Kackley Date of Exam: 10/23/2023 Medical Rec #:  [id number]  Height:       75.0 in Accession #:    [id number] Weight:       307.8 lb Date of Birth:  1955/06/23 BSA:          2.636 m Patient Age:    67 years   BP:           132/82 mmHg Patient Gender: M          HR:           63 bpm. Exam Location:  [facility]  Procedure: 2D Echo, Cardiac Doppler, Color Doppler and Strain Analysis (Both Spectral and Color Flow Doppler were utilized during procedure).  Indications:    Coronary artery disease of native artery of native heart with stable angina pectoris (HCC) [I25.118]. Nonrheumatic aortic valve stenosis [I35.0]. Hypertensive heart disease without heart failure [I11.9]. CKD stage 3b, GFR 30-44 ml/min (HCC) [N18.32]. Mixed hyperlipidemia [E78.2] Aortic stenosis I35.0  History:        Patient has prior history of Echocardiogram examinations, most recent 07/04/2021. CAD, Aortic Valve Disease; Risk Factors:Hypertension and Dyslipidemia.  Sonographer:    Charlie Jointer RDCS Referring Phys: [id number] BRIAN J MUNLEY  IMPRESSIONS   1. Left ventricular ejection fraction, by estimation, is 60 to 65%. The left ventricle has normal function. The left ventricle demonstrates global hypokinesis. There is moderate left ventricular hypertrophy. Left ventricular diastolic parameters are indeterminate. The average left ventricular global longitudinal strain is -14.7 %. The global longitudinal strain is abnormal. 2. Right ventricular systolic function is normal. The  right ventricular size is normal. 3. The mitral valve is normal in structure. No evidence of mitral valve regurgitation. No evidence of mitral stenosis. 4. The aortic valve was not well visualized. Aortic valve regurgitation is not visualized. Moderate aortic valve stenosis. 5. Aneurysm of the ascending aorta, measuring 40 mm. 6. The inferior vena cava is normal in size with greater than 50% respiratory variability, suggesting right atrial pressure of 3 mmHg.  FINDINGS Left Ventricle: Left ventricular ejection fraction, by estimation, is 60 to 65%. The left ventricle has normal function. The left ventricle demonstrates global hypokinesis. The average left ventricular global longitudinal strain is -14.7 %. Strain was performed and the global longitudinal strain is abnormal. The left ventricular internal cavity size was normal in size. There is moderate left ventricular hypertrophy. Left ventricular diastolic parameters are indeterminate.  Right Ventricle: The right ventricular size is normal. No increase in right ventricular wall thickness. Right ventricular systolic function is normal.  Left Atrium: Left atrial size was normal in size.  Right Atrium: Right atrial size was normal in size.  Pericardium: There is no evidence of pericardial effusion.  Mitral Valve: The mitral valve is normal in structure. No evidence of mitral valve regurgitation. No evidence of mitral valve stenosis.  Tricuspid Valve: The tricuspid valve is normal in structure. Tricuspid valve regurgitation is not demonstrated. No evidence of tricuspid stenosis.  Aortic Valve: The aortic valve was not well visualized. Aortic valve regurgitation is not visualized. Moderate aortic stenosis is present. Aortic valve mean gradient measures 31.0 mmHg. Aortic valve peak gradient measures  63.4 mmHg. Aortic valve area, by VTI measures 1.24 cm.  Pulmonic Valve: The pulmonic valve was normal in structure. Pulmonic valve regurgitation is  not visualized. No evidence of pulmonic stenosis.  Aorta: The aortic root is normal in size and structure. There is an aneurysm involving the ascending aorta measuring 40 mm.  Venous: The inferior vena cava is normal in size with greater than 50% respiratory variability, suggesting right atrial pressure of 3 mmHg.  IAS/Shunts: No atrial level shunt detected by color flow Doppler.   LEFT VENTRICLE PLAX 2D LVIDd:         5.00 cm   Diastology LVIDs:         3.00 cm   LV e' medial:    6.06 cm/s LV PW:         1.50 cm   LV E/e' medial:  13.8 LV IVS:        1.50 cm   LV e' lateral:   5.87 cm/s LVOT diam:     2.00 cm   LV E/e' lateral: 14.2 LV SV:         89 LV SV Index:   34        2D Longitudinal Strain LVOT Area:     3.14 cm  2D Strain GLS Avg:     -14.7 %   RIGHT VENTRICLE             IVC RV Basal diam:  4.10 cm     IVC diam: 2.80 cm RV Mid diam:    2.70 cm RV S prime:     18.60 cm/s TAPSE (M-mode): 2.8 cm  LEFT ATRIUM              Index        RIGHT ATRIUM           Index LA diam:        3.80 cm  1.44 cm/m   RA Area:     16.50 cm LA Vol (A2C):   115.0 ml 43.63 ml/m  RA Volume:   42.20 ml  16.01 ml/m LA Vol (A4C):   47.6 ml  18.06 ml/m LA Biplane Vol: 78.9 ml  29.94 ml/m AORTIC VALVE AV Area (Vmax):    1.07 cm AV Area (Vmean):   1.08 cm AV Area (VTI):     1.24 cm AV Vmax:           398.00 cm/s AV Vmean:          254.600 cm/s AV VTI:            0.720 m AV Peak Grad:      63.4 mmHg AV Mean Grad:      31.0 mmHg LVOT Vmax:         135.33 cm/s LVOT Vmean:        87.700 cm/s LVOT VTI:          0.284 m LVOT/AV VTI ratio: 0.39  AORTA Ao Root diam: 3.90 cm Ao Asc diam:  4.00 cm Ao Desc diam: 2.50 cm  MITRAL VALVE MV Area (PHT): 3.64 cm    SHUNTS MV Decel Time: 209 msec    Systemic VTI:  0.28 m MV E velocity: 83.60 cm/s  Systemic Diam: 2.00 cm MV A velocity: 76.25 cm/s MV E/A ratio:  1.10  Jennifer Crape MD Electronically signed by Jennifer Crape MD Signature  Date/Time: 10/23/2023/6:02:36 PM    Final    MONITORS  LONG TERM MONITOR (3-14 DAYS) 12/03/2023  Narrative Patch Wear Time:  4 days and 7 hours (2025-09-19T09:34:51-0400 to 2025-09-29T07:47:20-0400)  There were 2 diary events both atrial fibrillation.  Heart rate response is optimal on both recordings  There were no pauses 3 seconds or greater  Monitor 1 Atrial Fibrillation occurred continuously (100% burden), ranging from 45-182 bpm (avg of 81 bpm). Isolated VEs were rare (<1.0%), VE Couplets were rare (<1.0%), and no VE Triplets were present.  Monitor 2 Atrial Fibrillation occurred continuously (100% burden), ranging from 48-175 bpm (avg of 81 bpm). Isolated VEs were rare (<1.0%), VE Couplets were rare (<1.0%), and no VE Triplets were present.   CT SCANS  CT CORONARY MORPH W/CTA COR W/SCORE 12/10/2023  Addendum 12/18/2023  8:50 PM ADDENDUM REPORT: 12/18/2023 20:48  EXAM: OVER-READ INTERPRETATION  CT CHEST  The following report is an over-read performed by radiologist Dr. Oneil Devonshire of [facility] on 12/18/2023. This over-read does not include interpretation of cardiac or coronary anatomy or pathology. The coronary calcium score/coronary CTA interpretation by the cardiologist is attached.  COMPARISON:  CT of the chest from earlier in the same day.  FINDINGS: Cardiovascular: Mild atherosclerotic calcifications of the aorta are noted. Mild aneurysmal dilatation of the ascending aorta to 4.1 cm is noted. No dissection is seen. No evidence of pulmonary embolism is noted.  Mediastinum/Nodes: There are no enlarged lymph nodes within the visualized mediastinum.  Lungs/Pleura: There is no pleural effusion. The visualized lungs appear clear.  Upper abdomen: No significant findings in the visualized upper abdomen.  Musculoskeletal/Chest wall: Mild degenerative changes of the thoracic spine are seen.  IMPRESSION: Dilatation of the ascending aorta to  4.1 cm. Recommend annual imaging followup by CTA or MRA. This recommendation follows 2010 ACCF/AHA/AATS/ACR/ASA/SCA/SCAI/SIR/STS/SVM Guidelines for the Diagnosis and Management of Patients with Thoracic Aortic Disease. Circulation. 2010; 121: Z733-z630. Aortic aneurysm NOS (ICD10-I71.9)   Electronically Signed By: Oneil Devonshire M.D. On: 12/18/2023 20:48  Narrative CLINICAL DATA:  Aortic Stenosis.  TAVR planning.  EXAM: Cardiac TAVR CT  TECHNIQUE: A non-contrast, gated CT scan was obtained with axial slices of 2.5 mm through the heart for aortic valve scoring. A 100 kV retrospective, gated, contrast cardiac scan was obtained. Gantry rotation speed was 230 msec and collimation was 0.63 mm. Nitroglycerin  was not given. The 3D dataset was reconstructed in systole with motion correction. The 3D data set was reconstructed in 5% intervals of the 0-95% of the R-R cycle. Systolic and diastolic phases were analyzed on a dedicated workstation using MPR, MIP, and VRT modes. The patient received 100 cc of contrast.  FINDINGS: Image quality: Excellent.  Noise artifact is: Limited.  Valve Morphology: Tricuspid aortic valve with severe calcifications and restricted leaflet motion in systole.  Aortic Valve Calcium score: 2212  Aortic annular dimension:  Phase assessed: 30%  Annular area: 571 mm2  Annular perimeter: 85.9 mm  Max diameter: 29.8 mm  Min diameter: 25.2 mm  Annular and subannular calcification: None.  Membranous septum length: 9.0 mm  Optimal coplanar projection: LAO 10 CRA 3  Coronary Artery Height above Annulus:  Left Main: 14.1 mm  Right Coronary: 16.9 mm  Sinus of Valsalva Measurements:  Non-coronary: 35 mm  Right-coronary: 33 mm  Left-coronary: 35 mm  Sinus of Valsalva Height:  Non-coronary: 23.0 mm  Right-coronary: 21.9 mm  Left-coronary: 19.7 mm  Sinotubular Junction: 30 mm  Ascending Thoracic Aorta: 40 mm  Coronary Arteries: Normal  coronary origin. Right dominance. The study was performed without use of NTG and is insufficient for  plaque evaluation. Please refer to recent cardiac catheterization for coronary assessment.  Cardiac Morphology:  Right Atrium: Right atrial size is within normal limits.  Right Ventricle: The right ventricular cavity is within normal limits.  Left Atrium: Left atrial size is normal in size with no left atrial appendage filling defect.  Left Ventricle: The ventricular cavity size is within normal limits.  Pulmonary arteries: Dilated pulmonary artery suggestive of pulmonary hypertension.  Pulmonary veins: Normal pulmonary venous drainage.  Pericardium: Normal thickness with no significant effusion or calcium present.  Mitral Valve: The mitral valve is normal structure without significant calcification.  Extra-cardiac findings: See attached radiology report for non-cardiac structures.  IMPRESSION: 1. Tricuspid aortic valve with severe calcifications.  2. Annular measurements support a 29 mm S3 or 34 mm Evolut Pro.  3. No significant annular or subannular calcifications.  4. Sufficient coronary to annulus distance.  5. Optimal Fluoroscopic Angle for Delivery: LAO 10 CRA 3  6. Dilated pulmonary artery suggestive of pulmonary hypertension.  7. Mildly dilated ascending aorta up to 40 mm.  Darryle T. Barbaraann, MD  Electronically Signed: By: Darryle Barbaraann M.D. On: 12/10/2023 20:43   CT SCANS  CT CARDIAC SCORING (SELF PAY ONLY) 11/07/2023  Addendum 11/16/2023  4:18 PM ADDENDUM REPORT: 11/16/2023 16:15  EXAM: OVER-READ INTERPRETATION  CT CHEST  The following report is an over-read performed by radiologist Dr. Ree Levy [facility] on 11/16/2023. This over-read does not include interpretation of cardiac or coronary anatomy or pathology. The coronary calcium score interpretation by the cardiologist is attached.  COMPARISON:   None.  FINDINGS: Mediastinum/Nodes: No solid / cystic mediastinal masses. The visualized esophagus is nondistended precluding optimal assessment. No thoracic lymphadenopathy by size criteria.  Lungs/Pleura: Imaged tracheo-bronchial tree is patent. There are dependent changes in bilateral lungs. No mass or consolidation. No pleural effusion or pneumothorax. No suspicious lung nodules.  Upper Abdomen: Visualized upper abdominal viscera within normal limits.  Musculoskeletal: Bilateral mild symmetric gynecomastia noted. The visualized soft tissues of the chest wall are grossly unremarkable. No suspicious osseous lesions.  IMPRESSION: No acute or significant extracardiac findings.   Electronically Signed By: Ree Molt M.D. On: 11/16/2023 16:15  Narrative : CLINICAL DATA:  Cardiovascular Disease Risk stratification  EXAM:  Coronary Calcium Score  TECHNIQUE:  A gated, non-contrast computed tomography scan of the heart was  performed using 3mm slice thickness. Axial images were analyzed on a  dedicated workstation. Calcium scoring of the coronary arteries was  performed using the Agatston method.  FINDINGS:  Coronary Calcium Score:  Left main: 0  Left anterior descending artery: 72.2  Left circumflex artery: 0  Right coronary artery: 4.07  Total: 76.2  Pericardium: Normal.  Ascending Aorta: Normal caliber.  Pulmonary artery: Normal caliber  Non-cardiac: See separate report from [facility].  IMPRESSION:  Coronary calcium score of 76.2. This was 49 percentile for age-, race-,  and sex-matched controls.  Aortic valve calcium score: 2725  RECOMMENDATIONS:  Coronary artery calcium (CAC) score is a strong predictor of  incident coronary heart disease (CHD) and provides predictive  information beyond traditional risk factors. CAC scoring is  reasonable to use in the decision to withhold, postpone, or initiate  statin therapy in  intermediate-risk or selected borderline-risk  asymptomatic adults (age 23-75 years and LDL-C >=70 to <190 mg/dL)  who do not have diabetes or established atherosclerotic  cardiovascular disease (ASCVD).* In intermediate-risk (10-year ASCVD  risk >=7.5% to <20%) adults or selected borderline-risk (10-year  ASCVD risk >=  5% to <7.5%) adults in whom a CAC score is measured for  the purpose of making a treatment decision the following  recommendations have been made:  If CAC=0, it is reasonable to withhold statin therapy and reassess  in 5 to 10 years, as long as higher risk conditions are absent  (diabetes mellitus, family history of premature CHD in first degree  relatives (males <55 years; females <65 years), cigarette smoking,  or LDL >=190 mg/dL).  If CAC is 1 to 99, it is reasonable to initiate statin therapy for  patients >=76 years of age.  If CAC is >=100 or >=75th percentile, it is reasonable to initiate  statin therapy at any age.  Cardiology referral should be considered for patients with CAC  scores >=400 or >=75th percentile.  *2018 AHA/ACC/AACVPR/AAPA/ABC/ACPM/ADA/AGS/APhA/ASPC/NLA/PCNA  Guideline on the Management of Blood Cholesterol: A Report of the  American College of Cardiology/American Heart Association Task Force  on Clinical Practice Guidelines. J Am Coll Cardiol.  2019;73(24):3168-3209.  Electronically Signed: By: Lamar Fitch M.D. On: 11/07/2023 17:50     ______________________________________________________________________________________________      ECG afib    I have independently reviewed the above radiologic studies and discussed with the patient   Recent Lab Findings: Lab Results  Component Value Date   WBC 7.6 12/02/2023   HGB 14.8 12/02/2023   HCT 45.4 12/02/2023   PLT 193 12/02/2023   GLUCOSE 92 12/02/2023   CHOL 129 07/04/2021   TRIG 123 07/04/2021   HDL 33 (L) 07/04/2021   LDLCALC 74 07/04/2021   ALT 24  12/02/2023   AST 28 12/02/2023   NA 141 12/02/2023   K 4.2 12/02/2023   CL 102 12/02/2023   CREATININE 1.29 (H) 12/02/2023   BUN 20 12/02/2023   CO2 23 12/02/2023      Assessment / Plan:   68 y.o. male with moderate aortic stenosis.  He is also a part of the CAP trial  STS score: 1.92.  NYHA Class II.  The risks and benefits of transfermoral TAVR were discussed in detail.  We also discussed possibility of an emergent sternotomy to address any procedural complications.  Based on our discussion, we collectively decided that an emergent sternotomy would be indicated.  The patient is agreeable to proceed.  Based on my review of her LHC, echo, and CTA, I agree with the multidisciplinary plan to proceed with a 29mm S# TAVR.      I  spent 40 minutes counseling the patient face to face.   Linnie MALVA Rayas 12/19/2023 2:48 PM

## 2023-12-18 NOTE — Progress Notes (Unsigned)
 [address]       [place] [address]             [phone]        Deaven Urwin [facility] Medical Record #[id number] Date of Birth: Nov 29, 1955  Referring: Wendel Lurena POUR, MD Primary Care: Ofilia Lamar CROME, MD Primary Cardiologist:None  Chief Complaint:   No chief complaint on file.   History of Present Illness:     Zachary Andrews is a 68 y.o. male presents for surgical evaluation of moderate to severe aortic stenosis.  He is currently being evaluated int the CAP trial.  He occasionally has some shortness of breath, but denies any chest pain or dizziness.  He has previously undergone a left heart cath and there was no significant disease.    Past Medical History:  Diagnosis Date   Abdominal aortic atherosclerosis 05/19/2020   05/19/2020: on CT     Acute non-recurrent sinusitis 07/09/2023   07/09/2023     Acute renal insufficiency 09/16/2019   Aortic stenosis, moderate 09/09/2017   Formatting of this note might be different from the original.  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   Carbuncle 08/20/2023   08/20/2023: right axilla     Carotid stenosis, right 09/09/2017   Carpal tunnel syndrome, bilateral 06/01/2015   Central spinal stenosis 12/21/2016   Chronic bilateral low back pain with left-sided sciatica 07/11/2015   Coronary artery disease involving native coronary artery of native heart 12/21/2016   Deviated nasal septum 01/31/2022   01/31/2022, rightward     Elevated LFTs 08/22/2017   ICD-10 cut over   Frequent falls 10/15/2016   H/O spinal stenosis 08/22/2017   Hepatic steatosis 05/19/2020   05/19/2020: on CT     History of cigarette smoking 05/27/2022   Last 2014, 44 pack years     History of pulmonary embolism 09/21/2019   Hypertension 02/06/2015   Hypertensive heart disease 02/06/2015   Moderate LVH     Hypotension 09/16/2019   Incisive canal cyst  02/07/2022   02/07/2022:     Ingrown nail 09/29/2021   Intervertebral disc disorder with radiculopathy of lumbosacral region 10/04/2014   Ischial bursitis 08/03/2019   Lumbar disc disease 08/22/2017   Lumbar radiculopathy 12/25/2015   Lumbar spondylosis 04/29/2016   Lumbar stenosis with neurogenic claudication 12/21/2016   Microalbuminuria 08/22/2017   Mixed hyperlipidemia 03/13/2015   Refusing statin   Nephrolithiasis 12/21/2016   Neuropathy 08/19/2017   From cervical and lumbar spinal stenosis  And carpal tunnel   Non-recurrent unilateral inguinal hernia without obstruction or gangrene 12/23/2016   2018   Numbness 02/06/2015   Obesity 08/22/2017   Obstructive sleep apnea on CPAP 04/10/2016   Managed PULM   Occlusion of right internal carotid artery 12/21/2016   >70%   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   Osteoarthritis of shoulder 12/21/2016   Pain around toenail, right foot 09/29/2021   Pain in left knee 07/08/2017   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 2 diabetes mellitus (HCC) 09/13/2021   Polyp of colon 12/21/2016   Primary osteoarthritis of left hip 11/19/2016   Pulmonary embolism (HCC) 09/21/2019   Rib pain on left side 05/19/2020   05/19/2020:     Risk for falls 04/16/2021   04/16/2021 : high risk, is falling, will not use walker; has poor core   strength and foot neuropathy; declined PT,  etc; risk discussed     Sensorineural hearing loss (SNHL) of both ears 04/20/2018   Sexual dysfunction 08/22/2017   Spinal cord compression (HCC) 02/06/2015   Stage 2 chronic kidney disease 09/10/2017   Stage 3a chronic kidney disease (HCC) 10/23/2021   10/23/2021: 56, stable, HTN/DM/VASC     Surgical wound present 12/02/2022   12/02/2022 : LSS     Suspected severe acute respiratory syndrome coronavirus 2 (SARS-CoV-2) infection 09/16/2019   Symptoms involving cardiovascular system 01/12/2019   Tinea unguium 12/21/2016   Tinnitus of both ears 04/20/2018   Type 2  diabetes mellitus without complication, without long-term current use of insulin (HCC) 04/10/2016   Vitamin D deficiency 04/10/2016    Past Surgical History:  Procedure Laterality Date   CARPAL TUNNEL RELEASE Bilateral    CERVICAL SPINE SURGERY     HEMORRHOID SURGERY     LUMBAR SPINE SURGERY     scraped nerves   PERIPHERAL NEUROPLASTY OF FINGER / HAND / ARM Bilateral    transposition median nerve at carpal tunnel   RIGHT HEART CATH AND CORONARY ANGIOGRAPHY N/A 11/27/2023   Procedure: RIGHT HEART CATH AND CORONARY ANGIOGRAPHY;  Surgeon: Thukkani, Arun K, MD;  Location: [facility];  Service: Cardiovascular;  Laterality: N/A;   TONSILLECTOMY      Social History:  Social History   Tobacco Use  Smoking Status Former   Current packs/day: 0.00   Types: Cigarettes   Quit date: 11/25/2012   Years since quitting: 11.0   Passive exposure: Past  Smokeless Tobacco Never    Social History   Substance and Sexual Activity  Alcohol Use Not Currently     Allergies  Allergen Reactions   Bee Venom Anaphylaxis   Atorvastatin Calcium Other (See Comments)    Muscle aches   Ezetimibe Other (See Comments)    Myalgias     Dapagliflozin     Other reaction(s): GI Upset (intolerance)   Metformin Other (See Comments)    Other reaction(s): Myalgias (intolerance) Tried on and off with same sx   Pravastatin Other (See Comments)    Muscle aches   Rosuvastatin Other (See Comments)    Muscle aches   Atorvastatin Other (See Comments)    muscle aches; myalgia   Gabapentin Other (See Comments)    confusion      Current Outpatient Medications  Medication Sig Dispense Refill   amoxicillin  (AMOXIL ) 500 MG capsule Take 4 capsules (2,000 mg total) by mouth daily as needed. Take two hours prior to your dental work. 20 capsule 1   Ascorbic Acid (VITAMIN C) 1000 MG tablet Take 2,000 mg by mouth daily.     aspirin  EC 81 MG tablet Take 81 mg by mouth daily. Swallow whole. (Patient taking  differently: Take 81 mg by mouth in the morning and at bedtime. Swallow whole.)     carboxymethylcellulose 1 % ophthalmic solution Place 1 drop into both eyes daily as needed (Dry eyes).     Dulaglutide (TRULICITY) 4.5 MG/0.5ML SOPN Inject 4.5 mg into the skin every 7 (seven) days.     Evolocumab  (REPATHA  SURECLICK) 140 MG/ML SOAJ Inject 1 pen into the skin every 14 (fourteen) days. 2 pen 6   fluticasone (FLONASE) 50 MCG/ACT nasal spray 1 spray by Both Nostrils route daily as needed for Rhinitis.     furosemide  (LASIX ) 20 MG tablet Take 1 tablet (20 mg total) by mouth daily. (Patient taking differently: Take 20 mg by mouth every other day.) 90 tablet 3  lisinopril (ZESTRIL) 20 MG tablet Take 20 mg by mouth daily.     nitroGLYCERIN  (NITROSTAT ) 0.4 MG SL tablet Place 1 tablet (0.4 mg total) under the tongue every 5 (five) minutes as needed for chest pain. 25 tablet 1   pregabalin (LYRICA) 50 MG capsule Take 50 mg by mouth at bedtime.     traMADol (ULTRAM) 50 MG tablet Take 50 mg by mouth every 6 (six) hours as needed for moderate pain (pain score 4-6) or severe pain (pain score 7-10).     apixaban  (ELIQUIS ) 5 MG TABS tablet Take 1 tablet (5 mg total) by mouth 2 (two) times daily. (Patient not taking: Reported on 12/02/2023) 180 tablet 3   No current facility-administered medications for this visit.    (Not in a hospital admission)   Family History  Problem Relation Age of Onset   Heart attack Father    Leukemia Brother    Congestive Heart Failure Mother    Bone cancer Mother      Review of Systems:   Review of Systems  Constitutional: Negative.   Respiratory:  Positive for shortness of breath.   Cardiovascular:  Negative for chest pain.  Neurological:  Negative for dizziness.      Physical Exam: BP 115/71   Pulse 64   Resp 18   Ht 6' 2 (1.88 m)   Wt (!) 310 lb (140.6 kg)   SpO2 95%   BMI 39.80 kg/m  Physical Exam Constitutional:      General: He is not in acute  distress.    Appearance: He is not ill-appearing.  HENT:     Head: Normocephalic and atraumatic.  Eyes:     Extraocular Movements: Extraocular movements intact.  Cardiovascular:     Rate and Rhythm: Normal rate.  Pulmonary:     Effort: Pulmonary effort is normal. No respiratory distress.  Abdominal:     General: Abdomen is flat. There is no distension.  Musculoskeletal:        General: Normal range of motion.     Cervical back: Normal range of motion.  Skin:    General: Skin is warm and dry.  Neurological:     General: No focal deficit present.     Mental Status: He is alert and oriented to person, place, and time.       Cardiac Studies & Procedures   ______________________________________________________________________________________________ CARDIAC CATHETERIZATION  CARDIAC CATHETERIZATION 11/27/2023  Conclusion   1st Diag lesion is 20% stenosed.  1.  Minimal, nonobstructive coronary artery disease. 2.  Fick cardiac output of 9.9 L/min and Fick cardiac index of 3.8 L/min/m with the following hemodynamics: Right atrial pressure mean of 15 mmHg Right ventricular pressure 37/8 with end-diastolic pressure of 16 mmHg Wedge pressure mean of 23 mmHg V waves to 28 mmHg PA pressure of 32/26 with a mean of 29 mmHg PVR of 0.6 Woods units PA pulsatility necks of 0.4 3.  Capacious iliofemoral vessels bilaterally.  Recommendation: Given elevated filling pressures and low PA pulsatility index will intensify Lasix  to 20 daily and check BMP in 1 week.  Will arrange outpatient referral to discuss management of aortic stenosis.  Findings Coronary Findings Diagnostic  Dominance: Right  Left Anterior Descending The vessel exhibits minimal luminal irregularities.  First Diagonal Branch 1st Diag lesion is 20% stenosed.  Left Circumflex The vessel exhibits minimal luminal irregularities.  Right Coronary Artery The vessel exhibits minimal luminal  irregularities.  Intervention  No interventions have been documented.   STRESS TESTS  MYOCARDIAL PERFUSION IMAGING 10/13/2019  Interpretation Summary  The left ventricular ejection fraction is normal (55-65%).  Nuclear stress EF: 63%.  There was no ST segment deviation noted during stress.  This is a low risk study.   ECHOCARDIOGRAM  ECHOCARDIOGRAM COMPLETE 10/23/2023  Narrative ECHOCARDIOGRAM REPORT    Patient Name:   Zachary Andrews Date of Exam: 10/23/2023 Medical Rec #:  [id number]  Height:       75.0 in Accession #:    [id number] Weight:       307.8 lb Date of Birth:  [birth year] BSA:          2.636 m Patient Age:    67 years   BP:           132/82 mmHg Patient Gender: M          HR:           63 bpm. Exam Location:  [place]  Procedure: 2D Echo, Cardiac Doppler, Color Doppler and Strain Analysis (Both Spectral and Color Flow Doppler were utilized during procedure).  Indications:    Coronary artery disease of native artery of native heart with stable angina pectoris (HCC) [I25.118]. Nonrheumatic aortic valve stenosis [I35.0]. Hypertensive heart disease without heart failure [I11.9]. CKD stage 3b, GFR 30-44 ml/min (HCC) [N18.32]. Mixed hyperlipidemia [E78.2] Aortic stenosis I35.0  History:        Patient has prior history of Echocardiogram examinations, most recent 07/04/2021. CAD, Aortic Valve Disease; Risk Factors:Hypertension and Dyslipidemia.  Sonographer:    Charlie Jointer RDCS Referring Phys: [id number] BRIAN J MUNLEY  IMPRESSIONS   1. Left ventricular ejection fraction, by estimation, is 60 to 65%. The left ventricle has normal function. The left ventricle demonstrates global hypokinesis. There is moderate left ventricular hypertrophy. Left ventricular diastolic parameters are indeterminate. The average left ventricular global longitudinal strain is -14.7 %. The global longitudinal strain is abnormal. 2. Right ventricular systolic function is normal. The  right ventricular size is normal. 3. The mitral valve is normal in structure. No evidence of mitral valve regurgitation. No evidence of mitral stenosis. 4. The aortic valve was not well visualized. Aortic valve regurgitation is not visualized. Moderate aortic valve stenosis. 5. Aneurysm of the ascending aorta, measuring 40 mm. 6. The inferior vena cava is normal in size with greater than 50% respiratory variability, suggesting right atrial pressure of 3 mmHg.  FINDINGS Left Ventricle: Left ventricular ejection fraction, by estimation, is 60 to 65%. The left ventricle has normal function. The left ventricle demonstrates global hypokinesis. The average left ventricular global longitudinal strain is -14.7 %. Strain was performed and the global longitudinal strain is abnormal. The left ventricular internal cavity size was normal in size. There is moderate left ventricular hypertrophy. Left ventricular diastolic parameters are indeterminate.  Right Ventricle: The right ventricular size is normal. No increase in right ventricular wall thickness. Right ventricular systolic function is normal.  Left Atrium: Left atrial size was normal in size.  Right Atrium: Right atrial size was normal in size.  Pericardium: There is no evidence of pericardial effusion.  Mitral Valve: The mitral valve is normal in structure. No evidence of mitral valve regurgitation. No evidence of mitral valve stenosis.  Tricuspid Valve: The tricuspid valve is normal in structure. Tricuspid valve regurgitation is not demonstrated. No evidence of tricuspid stenosis.  Aortic Valve: The aortic valve was not well visualized. Aortic valve regurgitation is not visualized. Moderate aortic stenosis is present. Aortic valve mean gradient measures 31.0 mmHg. Aortic valve peak gradient measures  63.4 mmHg. Aortic valve area, by VTI measures 1.24 cm.  Pulmonic Valve: The pulmonic valve was normal in structure. Pulmonic valve regurgitation is  not visualized. No evidence of pulmonic stenosis.  Aorta: The aortic root is normal in size and structure. There is an aneurysm involving the ascending aorta measuring 40 mm.  Venous: The inferior vena cava is normal in size with greater than 50% respiratory variability, suggesting right atrial pressure of 3 mmHg.  IAS/Shunts: No atrial level shunt detected by color flow Doppler.   LEFT VENTRICLE PLAX 2D LVIDd:         5.00 cm   Diastology LVIDs:         3.00 cm   LV e' medial:    6.06 cm/s LV PW:         1.50 cm   LV E/e' medial:  13.8 LV IVS:        1.50 cm   LV e' lateral:   5.87 cm/s LVOT diam:     2.00 cm   LV E/e' lateral: 14.2 LV SV:         89 LV SV Index:   34        2D Longitudinal Strain LVOT Area:     3.14 cm  2D Strain GLS Avg:     -14.7 %   RIGHT VENTRICLE             IVC RV Basal diam:  4.10 cm     IVC diam: 2.80 cm RV Mid diam:    2.70 cm RV S prime:     18.60 cm/s TAPSE (M-mode): 2.8 cm  LEFT ATRIUM              Index        RIGHT ATRIUM           Index LA diam:        3.80 cm  1.44 cm/m   RA Area:     16.50 cm LA Vol (A2C):   115.0 ml 43.63 ml/m  RA Volume:   42.20 ml  16.01 ml/m LA Vol (A4C):   47.6 ml  18.06 ml/m LA Biplane Vol: 78.9 ml  29.94 ml/m AORTIC VALVE AV Area (Vmax):    1.07 cm AV Area (Vmean):   1.08 cm AV Area (VTI):     1.24 cm AV Vmax:           398.00 cm/s AV Vmean:          254.600 cm/s AV VTI:            0.720 m AV Peak Grad:      63.4 mmHg AV Mean Grad:      31.0 mmHg LVOT Vmax:         135.33 cm/s LVOT Vmean:        87.700 cm/s LVOT VTI:          0.284 m LVOT/AV VTI ratio: 0.39  AORTA Ao Root diam: 3.90 cm Ao Asc diam:  4.00 cm Ao Desc diam: 2.50 cm  MITRAL VALVE MV Area (PHT): 3.64 cm    SHUNTS MV Decel Time: 209 msec    Systemic VTI:  0.28 m MV E velocity: 83.60 cm/s  Systemic Diam: 2.00 cm MV A velocity: 76.25 cm/s MV E/A ratio:  1.10  Jennifer Crape MD Electronically signed by Jennifer Crape MD Signature  Date/Time: 10/23/2023/6:02:36 PM    Final    MONITORS  LONG TERM MONITOR (3-14 DAYS) 12/03/2023  Narrative Patch Wear Time:  4 days and 7 hours (2025-09-19T09:34:51-0400 to 2025-09-29T07:47:20-0400)  There were 2 diary events both atrial fibrillation.  Heart rate response is optimal on both recordings  There were no pauses 3 seconds or greater  Monitor 1 Atrial Fibrillation occurred continuously (100% burden), ranging from 45-182 bpm (avg of 81 bpm). Isolated VEs were rare (<1.0%), VE Couplets were rare (<1.0%), and no VE Triplets were present.  Monitor 2 Atrial Fibrillation occurred continuously (100% burden), ranging from 48-175 bpm (avg of 81 bpm). Isolated VEs were rare (<1.0%), VE Couplets were rare (<1.0%), and no VE Triplets were present.   CT SCANS  CT CORONARY MORPH W/CTA COR W/SCORE 12/10/2023  Addendum 12/18/2023  8:50 PM ADDENDUM REPORT: 12/18/2023 20:48  EXAM: OVER-READ INTERPRETATION  CT CHEST  The following report is an over-read performed by radiologist Dr. Oneil Devonshire of [facility] on 12/18/2023. This over-read does not include interpretation of cardiac or coronary anatomy or pathology. The coronary calcium score/coronary CTA interpretation by the cardiologist is attached.  COMPARISON:  CT of the chest from earlier in the same day.  FINDINGS: Cardiovascular: Mild atherosclerotic calcifications of the aorta are noted. Mild aneurysmal dilatation of the ascending aorta to 4.1 cm is noted. No dissection is seen. No evidence of pulmonary embolism is noted.  Mediastinum/Nodes: There are no enlarged lymph nodes within the visualized mediastinum.  Lungs/Pleura: There is no pleural effusion. The visualized lungs appear clear.  Upper abdomen: No significant findings in the visualized upper abdomen.  Musculoskeletal/Chest wall: Mild degenerative changes of the thoracic spine are seen.  IMPRESSION: Dilatation of the ascending aorta to  4.1 cm. Recommend annual imaging followup by CTA or MRA. This recommendation follows 2010 ACCF/AHA/AATS/ACR/ASA/SCA/SCAI/SIR/STS/SVM Guidelines for the Diagnosis and Management of Patients with Thoracic Aortic Disease. Circulation. 2010; 121: Z733-z630. Aortic aneurysm NOS (ICD10-I71.9)   Electronically Signed By: Oneil Devonshire M.D. On: 12/18/2023 20:48  Narrative CLINICAL DATA:  Aortic Stenosis.  TAVR planning.  EXAM: Cardiac TAVR CT  TECHNIQUE: A non-contrast, gated CT scan was obtained with axial slices of 2.5 mm through the heart for aortic valve scoring. A 100 kV retrospective, gated, contrast cardiac scan was obtained. Gantry rotation speed was 230 msec and collimation was 0.63 mm. Nitroglycerin  was not given. The 3D dataset was reconstructed in systole with motion correction. The 3D data set was reconstructed in 5% intervals of the 0-95% of the R-R cycle. Systolic and diastolic phases were analyzed on a dedicated workstation using MPR, MIP, and VRT modes. The patient received 100 cc of contrast.  FINDINGS: Image quality: Excellent.  Noise artifact is: Limited.  Valve Morphology: Tricuspid aortic valve with severe calcifications and restricted leaflet motion in systole.  Aortic Valve Calcium score: 2212  Aortic annular dimension:  Phase assessed: 30%  Annular area: 571 mm2  Annular perimeter: 85.9 mm  Max diameter: 29.8 mm  Min diameter: 25.2 mm  Annular and subannular calcification: None.  Membranous septum length: 9.0 mm  Optimal coplanar projection: LAO 10 CRA 3  Coronary Artery Height above Annulus:  Left Main: 14.1 mm  Right Coronary: 16.9 mm  Sinus of Valsalva Measurements:  Non-coronary: 35 mm  Right-coronary: 33 mm  Left-coronary: 35 mm  Sinus of Valsalva Height:  Non-coronary: 23.0 mm  Right-coronary: 21.9 mm  Left-coronary: 19.7 mm  Sinotubular Junction: 30 mm  Ascending Thoracic Aorta: 40 mm  Coronary Arteries: Normal  coronary origin. Right dominance. The study was performed without use of NTG and is insufficient for  plaque evaluation. Please refer to recent cardiac catheterization for coronary assessment.  Cardiac Morphology:  Right Atrium: Right atrial size is within normal limits.  Right Ventricle: The right ventricular cavity is within normal limits.  Left Atrium: Left atrial size is normal in size with no left atrial appendage filling defect.  Left Ventricle: The ventricular cavity size is within normal limits.  Pulmonary arteries: Dilated pulmonary artery suggestive of pulmonary hypertension.  Pulmonary veins: Normal pulmonary venous drainage.  Pericardium: Normal thickness with no significant effusion or calcium present.  Mitral Valve: The mitral valve is normal structure without significant calcification.  Extra-cardiac findings: See attached radiology report for non-cardiac structures.  IMPRESSION: 1. Tricuspid aortic valve with severe calcifications.  2. Annular measurements support a 29 mm S3 or 34 mm Evolut Pro.  3. No significant annular or subannular calcifications.  4. Sufficient coronary to annulus distance.  5. Optimal Fluoroscopic Angle for Delivery: LAO 10 CRA 3  6. Dilated pulmonary artery suggestive of pulmonary hypertension.  7. Mildly dilated ascending aorta up to 40 mm.  Darryle T. Barbaraann, MD  Electronically Signed: By: Darryle Barbaraann M.D. On: 12/10/2023 20:43   CT SCANS  CT CARDIAC SCORING (SELF PAY ONLY) 11/07/2023  Addendum 11/16/2023  4:18 PM ADDENDUM REPORT: 11/16/2023 16:15  EXAM: OVER-READ INTERPRETATION  CT CHEST  The following report is an over-read performed by radiologist Dr. Ree Levy [facility] on 11/16/2023. This over-read does not include interpretation of cardiac or coronary anatomy or pathology. The coronary calcium score interpretation by the cardiologist is attached.  COMPARISON:   None.  FINDINGS: Mediastinum/Nodes: No solid / cystic mediastinal masses. The visualized esophagus is nondistended precluding optimal assessment. No thoracic lymphadenopathy by size criteria.  Lungs/Pleura: Imaged tracheo-bronchial tree is patent. There are dependent changes in bilateral lungs. No mass or consolidation. No pleural effusion or pneumothorax. No suspicious lung nodules.  Upper Abdomen: Visualized upper abdominal viscera within normal limits.  Musculoskeletal: Bilateral mild symmetric gynecomastia noted. The visualized soft tissues of the chest wall are grossly unremarkable. No suspicious osseous lesions.  IMPRESSION: No acute or significant extracardiac findings.   Electronically Signed By: Ree Molt M.D. On: 11/16/2023 16:15  Narrative : CLINICAL DATA:  Cardiovascular Disease Risk stratification  EXAM:  Coronary Calcium Score  TECHNIQUE:  A gated, non-contrast computed tomography scan of the heart was  performed using 3mm slice thickness. Axial images were analyzed on a  dedicated workstation. Calcium scoring of the coronary arteries was  performed using the Agatston method.  FINDINGS:  Coronary Calcium Score:  Left main: 0  Left anterior descending artery: 72.2  Left circumflex artery: 0  Right coronary artery: 4.07  Total: 76.2  Pericardium: Normal.  Ascending Aorta: Normal caliber.  Pulmonary artery: Normal caliber  Non-cardiac: See separate report from [facility].  IMPRESSION:  Coronary calcium score of 76.2. This was 81 percentile for age-, race-,  and sex-matched controls.  Aortic valve calcium score: 2725  RECOMMENDATIONS:  Coronary artery calcium (CAC) score is a strong predictor of  incident coronary heart disease (CHD) and provides predictive  information beyond traditional risk factors. CAC scoring is  reasonable to use in the decision to withhold, postpone, or initiate  statin therapy in  intermediate-risk or selected borderline-risk  asymptomatic adults (age 71-75 years and LDL-C >=70 to <190 mg/dL)  who do not have diabetes or established atherosclerotic  cardiovascular disease (ASCVD).* In intermediate-risk (10-year ASCVD  risk >=7.5% to <20%) adults or selected borderline-risk (10-year  ASCVD risk >=  5% to <7.5%) adults in whom a CAC score is measured for  the purpose of making a treatment decision the following  recommendations have been made:  If CAC=0, it is reasonable to withhold statin therapy and reassess  in 5 to 10 years, as long as higher risk conditions are absent  (diabetes mellitus, family history of premature CHD in first degree  relatives (males <55 years; females <65 years), cigarette smoking,  or LDL >=190 mg/dL).  If CAC is 1 to 99, it is reasonable to initiate statin therapy for  patients >=36 years of age.  If CAC is >=100 or >=75th percentile, it is reasonable to initiate  statin therapy at any age.  Cardiology referral should be considered for patients with CAC  scores >=400 or >=75th percentile.  *2018 AHA/ACC/AACVPR/AAPA/ABC/ACPM/ADA/AGS/APhA/ASPC/NLA/PCNA  Guideline on the Management of Blood Cholesterol: A Report of the  American College of Cardiology/American Heart Association Task Force  on Clinical Practice Guidelines. J Am Coll Cardiol.  2019;73(24):3168-3209.  Electronically Signed: By: Lamar Fitch M.D. On: 11/07/2023 17:50     ______________________________________________________________________________________________      ECG afib    I have independently reviewed the above radiologic studies and discussed with the patient   Recent Lab Findings: Lab Results  Component Value Date   WBC 7.6 12/02/2023   HGB 14.8 12/02/2023   HCT 45.4 12/02/2023   PLT 193 12/02/2023   GLUCOSE 92 12/02/2023   CHOL 129 07/04/2021   TRIG 123 07/04/2021   HDL 33 (L) 07/04/2021   LDLCALC 74 07/04/2021   ALT 24  12/02/2023   AST 28 12/02/2023   NA 141 12/02/2023   K 4.2 12/02/2023   CL 102 12/02/2023   CREATININE 1.29 (H) 12/02/2023   BUN 20 12/02/2023   CO2 23 12/02/2023      Assessment / Plan:   68 y.o. male with moderate aortic stenosis.  He is also a part of the CAP trial  STS score: 1.92.  NYHA Class II.  The risks and benefits of transfermoral TAVR were discussed in detail.  We also discussed possibility of an emergent sternotomy to address any procedural complications.  Based on our discussion, we collectively decided that an emergent sternotomy would be indicated.  The patient is agreeable to proceed.  Based on my review of her LHC, echo, and CTA, I agree with the multidisciplinary plan to proceed with a 29mm S# TAVR.      I  spent 40 minutes counseling the patient face to face.   Linnie MALVA Rayas 12/19/2023 2:48 PM

## 2023-12-19 ENCOUNTER — Ambulatory Visit
Attending: Thoracic Surgery (Cardiothoracic Vascular Surgery) | Admitting: Thoracic Surgery (Cardiothoracic Vascular Surgery)

## 2023-12-19 VITALS — BP 115/71 | HR 64 | Resp 18 | Ht 74.0 in | Wt 310.0 lb

## 2023-12-19 DIAGNOSIS — I350 Nonrheumatic aortic (valve) stenosis: Secondary | ICD-10-CM | POA: Diagnosis not present

## 2023-12-19 NOTE — Progress Notes (Signed)
 Pre Surgical Assessment: 5 M Walk Test  19M=16.91ft  5 Meter Walk Test- trial 1: 6.81 seconds 5 Meter Walk Test- trial 2: 7.68 seconds 5 Meter Walk Test- trial 3: 8.11 seconds 5 Meter Walk Test Average: 7.53 seconds

## 2023-12-23 NOTE — Progress Notes (Signed)
 Hey Dr. Ofilia, I spoke with Heron, who is listed on HIPAA form.  She reports she took patient to the ED due to having hip and back pain and hard for him to get up.  She reports they are treating him for muscle strain and wanted patient to follow up with you from ED. Please have office schedule patient an apt to follow up.  Thanks, Jon

## 2023-12-23 NOTE — Progress Notes (Signed)
 [facility] Transitional Care Management     Situation   Zachary Andrews is a 68 y.o. male who was contacted today for an ED  outreach.  Admission Date: 12/22/23  Discharge Date:12/23/23 (ED visit only)   Institution: [facility]  Diagnosis:  Muscle strain, abdomen pain   Is this visit eligible for TCM? No (ED visit only)  Background  12/23/23 Initial ED outreach x 1st attempt. HN called patient home/mobile # [phone] and spoke with Heron, who is listed on hipaa form, briefly.    Since Discharge: HN received notification that patient went to [facility] ED on 12/22/23 for Muscle strain, abdomen pain.  HN reviewed chart and patient was given Flexeril 5 mg 1 po tid.  HN spoke with Heron, who is listed on HIPAA form. She reports patient was having hip and back pain and was hard for him to even get up and that is why she took him to the ED.  She reports they are treating him for muscle strain. They wanted him to follow up with PCP.  She reports she just picked muscle relaxer.  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 She reports he hasn't been home yet and they are on their way now. She denies any chest pain or shortness of breath. She reports he is eating and drinking okay.  No other issues or concerns. She said she will reach out to the office to get an apt. HN notified her that would send to PCP/office and have them follow up with for an apt.  HN notified Heron to call PCP office if any new issues or concerns.  No further HN services warranted at this time.   Primary Care Provider on Record: Lamar LITTIE Hobby, MD   Assessment    General Assessment     Type of Visit Telephone   Assessment Completed With Spouse or significant other   Interpreter Used No   Preferred Language English   Living Arrangement Significant other   Support System Significant other       List of Assessments:  General  Assessment:  What type of visit: Telephone   Assessment completed with: Spouse or significant other Was an interpreter used?: No Language Preferred: English   Living arrangement:: Significant other Support system:: Significant other   Recommendation    PCP/specialist notified: Yes  Referral Made: No  Referrals made to other disciplines: None   Future Appointments  Date Time Provider Department Center  01/08/2024  2:00 PM Lamar LITTIE Hobby, MD [facility] [address]  02/11/2024  8:30 AM Lamar LITTIE Hobby, MD [facility] [address]     Jon Sharps, RN [facility] [phone]    Electronically signed by: Jon Earnie Sharps, RN 12/23/2023 1:38 PM

## 2023-12-24 NOTE — Telephone Encounter (Signed)
 Canceling appt yesterday resulted in surgery  Need to reschedule appt    Pt has cardiac surgery 01/06/24

## 2023-12-29 ENCOUNTER — Other Ambulatory Visit: Payer: Self-pay

## 2023-12-29 DIAGNOSIS — I350 Nonrheumatic aortic (valve) stenosis: Secondary | ICD-10-CM

## 2024-01-02 ENCOUNTER — Ambulatory Visit (HOSPITAL_COMMUNITY)
Admission: RE | Admit: 2024-01-02 | Discharge: 2024-01-02 | Disposition: A | Discharge status: Home / Self Care | Source: Ambulatory Visit | Attending: Internal Medicine | Admitting: Internal Medicine

## 2024-01-02 ENCOUNTER — Other Ambulatory Visit: Payer: Self-pay

## 2024-01-02 ENCOUNTER — Encounter (HOSPITAL_COMMUNITY)
Admission: RE | Admit: 2024-01-02 | Discharge: 2024-01-02 | Disposition: A | Discharge status: Home / Self Care | Source: Ambulatory Visit | Attending: Internal Medicine | Admitting: Internal Medicine

## 2024-01-02 DIAGNOSIS — I350 Nonrheumatic aortic (valve) stenosis: Secondary | ICD-10-CM | POA: Diagnosis present

## 2024-01-02 DIAGNOSIS — Z01818 Encounter for other preprocedural examination: Secondary | ICD-10-CM | POA: Insufficient documentation

## 2024-01-02 LAB — URINALYSIS, ROUTINE W REFLEX MICROSCOPIC
Bilirubin Urine: NEGATIVE
Glucose, UA: 250 mg/dL — AB
Ketones, ur: NEGATIVE mg/dL
Leukocytes,Ua: NEGATIVE
Nitrite: NEGATIVE
Protein, ur: 30 mg/dL — AB
Specific Gravity, Urine: >1.03 — ABNORMAL HIGH (ref 1.005–1.030)
pH: 6 (ref 5.0–8.0)

## 2024-01-02 LAB — SURGICAL PCR SCREEN
MRSA, PCR: NEGATIVE
Staphylococcus aureus: NEGATIVE

## 2024-01-02 LAB — CBC
HCT: 48.9 % (ref 39.0–52.0)
Hemoglobin: 16 g/dL (ref 13.0–17.0)
MCH: 30.1 pg (ref 26.0–34.0)
MCHC: 32.7 g/dL (ref 30.0–36.0)
MCV: 92.1 fL (ref 80.0–100.0)
Platelets: 178 K/uL (ref 150–400)
RBC: 5.31 MIL/uL (ref 4.22–5.81)
RDW: 13.6 % (ref 11.5–15.5)
WBC: 6.9 K/uL (ref 4.0–10.5)
nRBC: 0 % (ref 0.0–0.2)

## 2024-01-02 LAB — TYPE AND SCREEN
ABO/RH(D): A POS
Antibody Screen: NEGATIVE

## 2024-01-02 LAB — COMPREHENSIVE METABOLIC PANEL WITH GFR
ALT: 30 U/L (ref 0–44)
AST: 36 U/L (ref 15–41)
Albumin: 3.7 g/dL (ref 3.5–5.0)
Alkaline Phosphatase: 61 U/L (ref 38–126)
Anion gap: 10 (ref 5–15)
BUN: 19 mg/dL (ref 8–23)
CO2: 26 mmol/L (ref 22–32)
Calcium: 9.5 mg/dL (ref 8.9–10.3)
Chloride: 104 mmol/L (ref 98–111)
Creatinine, Ser: 1.32 mg/dL — ABNORMAL HIGH (ref 0.61–1.24)
GFR, Estimated: 59 mL/min — ABNORMAL LOW (ref >60)
Glucose, Bld: 169 mg/dL — ABNORMAL HIGH (ref 70–99)
Potassium: 3.9 mmol/L (ref 3.5–5.1)
Sodium: 140 mmol/L (ref 135–145)
Total Bilirubin: 0.5 mg/dL (ref 0.0–1.2)
Total Protein: 7 g/dL (ref 6.5–8.1)

## 2024-01-02 LAB — URINALYSIS, MICROSCOPIC (REFLEX)

## 2024-01-02 LAB — PROTIME-INR
INR: 1.1 (ref 0.8–1.2)
Prothrombin Time: 14.5 s (ref 11.4–15.2)

## 2024-01-02 NOTE — Progress Notes (Signed)
 All consents signed by patient at PAT lab appointment. Pt was sent home with printed copy of surgical instructions and CHG soap/CHG soap instructions. All instructions reviewed with patient and questions answered.  Patients chart send to anesthesia for review. Pt denies any respiratory illness/infection in the last two months.

## 2024-01-05 MED ORDER — NOREPINEPHRINE 4 MG/250ML-% IV SOLN
0.0000 ug/min | INTRAVENOUS | Status: AC
  Administered 2024-01-06: 2 ug/min via INTRAVENOUS
  Filled 2024-01-05: qty 250

## 2024-01-05 MED ORDER — HEPARIN 30,000 UNITS/1000 ML (OHS) CELLSAVER SOLUTION
Status: DC
  Filled 2024-01-05: qty 1000

## 2024-01-05 MED ORDER — POTASSIUM CHLORIDE 2 MEQ/ML IV SOLN
80.0000 meq | INTRAVENOUS | Status: DC
  Filled 2024-01-05: qty 40

## 2024-01-05 MED ORDER — MAGNESIUM SULFATE 50 % IJ SOLN
40.0000 meq | INTRAMUSCULAR | Status: DC
  Filled 2024-01-05: qty 9.85

## 2024-01-05 MED ORDER — DEXMEDETOMIDINE HCL IN NACL 400 MCG/100ML IV SOLN
0.1000 ug/kg/h | INTRAVENOUS | Status: AC
Start: 2024-01-06 — End: 2024-01-07
  Administered 2024-01-06: .7 ug/kg/h via INTRAVENOUS
  Filled 2024-01-05: qty 100

## 2024-01-05 MED ORDER — CEFAZOLIN SODIUM-DEXTROSE 3-4 GM/150ML-% IV SOLN
3.0000 g | INTRAVENOUS | Status: AC
  Administered 2024-01-06: 3 g via INTRAVENOUS
  Filled 2024-01-05 (×2): qty 150

## 2024-01-05 NOTE — Anesthesia Preprocedure Evaluation (Signed)
 Anesthesia Evaluation  Patient identified by MRN, date of birth, ID band Patient awake    Reviewed: Allergy & Precautions, NPO status , Patient's Chart, lab work & pertinent test results, reviewed documented beta blocker date and time   History of Anesthesia Complications Negative for: history of anesthetic complications  Airway Mallampati: II  TM Distance: >3 FB Neck ROM: Full    Dental no notable dental hx.    Pulmonary sleep apnea and Continuous Positive Airway Pressure Ventilation , former smoker, PE   Pulmonary exam normal breath sounds clear to auscultation       Cardiovascular hypertension, + CAD  Normal cardiovascular exam+ dysrhythmias Atrial Fibrillation + Valvular Problems/Murmurs AS  Rhythm:Regular Rate:Normal  Carotid stenosis  DI 0.39?  IMPRESSIONS     1. Left ventricular ejection fraction, by estimation, is 60 to 65%. The  left ventricle has normal function. The left ventricle demonstrates global  hypokinesis. There is moderate left ventricular hypertrophy. Left  ventricular diastolic parameters are  indeterminate. The average left ventricular global longitudinal strain is  -14.7 %. The global longitudinal strain is abnormal.   2. Right ventricular systolic function is normal. The right ventricular  size is normal.   3. The mitral valve is normal in structure. No evidence of mitral valve  regurgitation. No evidence of mitral stenosis.   4. The aortic valve was not well visualized. Aortic valve regurgitation  is not visualized. Moderate aortic valve stenosis.   5. Aneurysm of the ascending aorta, measuring 40 mm.   6. The inferior vena cava is normal in size with greater than 50%  respiratory variability, suggesting right atrial pressure of 3 mmHg.     Neuro/Psych neg Seizures  negative psych ROS   GI/Hepatic negative GI ROS, Neg liver ROS,,,  Endo/Other  diabetes, Type 2    Renal/GU CRFRenal disease   negative genitourinary   Musculoskeletal  (+) Arthritis ,    Abdominal   Peds  Hematology Hg 16   Anesthesia Other Findings   Reproductive/Obstetrics                              Anesthesia Physical Anesthesia Plan  ASA: 4  Anesthesia Plan: MAC   Post-op Pain Management:    Induction: Intravenous  PONV Risk Score and Plan: 1 and Propofol infusion and Treatment may vary due to age or medical condition  Airway Management Planned: Natural Airway  Additional Equipment:   Intra-op Plan:   Post-operative Plan:   Informed Consent: I have reviewed the patients History and Physical, chart, labs and discussed the procedure including the risks, benefits and alternatives for the proposed anesthesia with the patient or authorized representative who has indicated his/her understanding and acceptance.     Dental advisory given  Plan Discussed with: CRNA  Anesthesia Plan Comments:          Anesthesia Quick Evaluation

## 2024-01-06 ENCOUNTER — Inpatient Hospital Stay (HOSPITAL_COMMUNITY)
Admission: RE | Admit: 2024-01-06 | Discharge: 2024-01-07 | DRG: 266 | Disposition: A | Discharge status: Home / Self Care | Attending: Internal Medicine | Admitting: Internal Medicine

## 2024-01-06 ENCOUNTER — Inpatient Hospital Stay (HOSPITAL_COMMUNITY)

## 2024-01-06 ENCOUNTER — Other Ambulatory Visit: Payer: Self-pay

## 2024-01-06 ENCOUNTER — Encounter (HOSPITAL_COMMUNITY): Admission: RE | Disposition: A | Payer: Self-pay | Source: Home / Self Care | Attending: Internal Medicine

## 2024-01-06 ENCOUNTER — Inpatient Hospital Stay (HOSPITAL_COMMUNITY): Admitting: Certified Registered Nurse Anesthetist

## 2024-01-06 ENCOUNTER — Inpatient Hospital Stay (HOSPITAL_COMMUNITY): Admitting: Physician Assistant

## 2024-01-06 ENCOUNTER — Encounter (HOSPITAL_COMMUNITY): Payer: Self-pay | Admitting: Internal Medicine

## 2024-01-06 DIAGNOSIS — Z87442 Personal history of urinary calculi: Secondary | ICD-10-CM

## 2024-01-06 DIAGNOSIS — I358 Other nonrheumatic aortic valve disorders: Secondary | ICD-10-CM | POA: Diagnosis not present

## 2024-01-06 DIAGNOSIS — Z8249 Family history of ischemic heart disease and other diseases of the circulatory system: Secondary | ICD-10-CM

## 2024-01-06 DIAGNOSIS — E782 Mixed hyperlipidemia: Secondary | ICD-10-CM | POA: Diagnosis present

## 2024-01-06 DIAGNOSIS — Z91030 Bee allergy status: Secondary | ICD-10-CM | POA: Diagnosis not present

## 2024-01-06 DIAGNOSIS — Z952 Presence of prosthetic heart valve: Secondary | ICD-10-CM | POA: Diagnosis not present

## 2024-01-06 DIAGNOSIS — I350 Nonrheumatic aortic (valve) stenosis: Secondary | ICD-10-CM

## 2024-01-06 DIAGNOSIS — M1612 Unilateral primary osteoarthritis, left hip: Secondary | ICD-10-CM | POA: Diagnosis present

## 2024-01-06 DIAGNOSIS — I5033 Acute on chronic diastolic (congestive) heart failure: Secondary | ICD-10-CM | POA: Diagnosis present

## 2024-01-06 DIAGNOSIS — Z6838 Body mass index (BMI) 38.0-38.9, adult: Secondary | ICD-10-CM

## 2024-01-06 DIAGNOSIS — R319 Hematuria, unspecified: Secondary | ICD-10-CM | POA: Insufficient documentation

## 2024-01-06 DIAGNOSIS — I130 Hypertensive heart and chronic kidney disease with heart failure and stage 1 through stage 4 chronic kidney disease, or unspecified chronic kidney disease: Secondary | ICD-10-CM | POA: Diagnosis present

## 2024-01-06 DIAGNOSIS — Z87892 Personal history of anaphylaxis: Secondary | ICD-10-CM

## 2024-01-06 DIAGNOSIS — N1831 Chronic kidney disease, stage 3a: Secondary | ICD-10-CM | POA: Diagnosis present

## 2024-01-06 DIAGNOSIS — Z7901 Long term (current) use of anticoagulants: Secondary | ICD-10-CM | POA: Diagnosis not present

## 2024-01-06 DIAGNOSIS — E1122 Type 2 diabetes mellitus with diabetic chronic kidney disease: Secondary | ICD-10-CM | POA: Diagnosis present

## 2024-01-06 DIAGNOSIS — E669 Obesity, unspecified: Secondary | ICD-10-CM | POA: Diagnosis present

## 2024-01-06 DIAGNOSIS — I110 Hypertensive heart disease with heart failure: Secondary | ICD-10-CM

## 2024-01-06 DIAGNOSIS — R5381 Other malaise: Secondary | ICD-10-CM

## 2024-01-06 DIAGNOSIS — I2510 Atherosclerotic heart disease of native coronary artery without angina pectoris: Secondary | ICD-10-CM

## 2024-01-06 DIAGNOSIS — I10 Essential (primary) hypertension: Secondary | ICD-10-CM | POA: Diagnosis present

## 2024-01-06 DIAGNOSIS — Z9181 History of falling: Secondary | ICD-10-CM | POA: Diagnosis not present

## 2024-01-06 DIAGNOSIS — I700 Atherosclerosis of aorta: Secondary | ICD-10-CM | POA: Diagnosis present

## 2024-01-06 DIAGNOSIS — Z888 Allergy status to other drugs, medicaments and biological substances status: Secondary | ICD-10-CM

## 2024-01-06 DIAGNOSIS — Z87891 Personal history of nicotine dependence: Secondary | ICD-10-CM

## 2024-01-06 DIAGNOSIS — I480 Paroxysmal atrial fibrillation: Secondary | ICD-10-CM | POA: Diagnosis present

## 2024-01-06 DIAGNOSIS — Z86711 Personal history of pulmonary embolism: Secondary | ICD-10-CM

## 2024-01-06 DIAGNOSIS — Z860100 Personal history of colon polyps, unspecified: Secondary | ICD-10-CM

## 2024-01-06 DIAGNOSIS — Z006 Encounter for examination for normal comparison and control in clinical research program: Secondary | ICD-10-CM | POA: Diagnosis not present

## 2024-01-06 DIAGNOSIS — Z7982 Long term (current) use of aspirin: Secondary | ICD-10-CM | POA: Diagnosis not present

## 2024-01-06 DIAGNOSIS — Z806 Family history of leukemia: Secondary | ICD-10-CM | POA: Diagnosis not present

## 2024-01-06 HISTORY — PX: INTRAOPERATIVE TRANSTHORACIC ECHOCARDIOGRAM: SHX6523

## 2024-01-06 LAB — ECHOCARDIOGRAM LIMITED
AR max vel: 1.62 cm2
AV Area VTI: 1.81 cm2
AV Area mean vel: 1.78 cm2
AV Mean grad: 3 mmHg
AV Peak grad: 6.6 mmHg
Ao pk vel: 1.29 m/s
S' Lateral: 3.7 cm

## 2024-01-06 LAB — GLUCOSE, CAPILLARY
Glucose-Capillary: 123 mg/dL — ABNORMAL HIGH (ref 70–99)
Glucose-Capillary: 195 mg/dL — ABNORMAL HIGH (ref 70–99)
Glucose-Capillary: 140 mg/dL — ABNORMAL HIGH (ref 70–99)
Glucose-Capillary: 112 mg/dL — ABNORMAL HIGH (ref 70–99)
Glucose-Capillary: 136 mg/dL — ABNORMAL HIGH (ref 70–99)
Glucose-Capillary: 103 mg/dL — ABNORMAL HIGH (ref 70–99)

## 2024-01-06 LAB — POCT I-STAT, CHEM 8
BUN: 24 mg/dL — ABNORMAL HIGH (ref 8–23)
Calcium, Ion: 1.15 mmol/L (ref 1.15–1.40)
Chloride: 113 mmol/L — ABNORMAL HIGH (ref 98–111)
Creatinine, Ser: 1.3 mg/dL — ABNORMAL HIGH (ref 0.61–1.24)
Glucose, Bld: 215 mg/dL — ABNORMAL HIGH (ref 70–99)
HCT: 37 % — ABNORMAL LOW (ref 39.0–52.0)
Hemoglobin: 12.6 g/dL — ABNORMAL LOW (ref 13.0–17.0)
Potassium: 4.5 mmol/L (ref 3.5–5.1)
Sodium: 140 mmol/L (ref 135–145)
TCO2: 25 mmol/L (ref 22–32)

## 2024-01-06 LAB — POCT ACTIVATED CLOTTING TIME
Activated Clotting Time: 118 s
Activated Clotting Time: 348 s

## 2024-01-06 LAB — ABO/RH: ABO/RH(D): A POS

## 2024-01-06 MED ORDER — NITROGLYCERIN IN D5W 200-5 MCG/ML-% IV SOLN: 0.0000 ug/min | INTRAVENOUS | Status: DC

## 2024-01-06 MED ORDER — INSULIN ASPART 100 UNIT/ML IJ SOLN: 0.0000 [IU] | INTRAMUSCULAR | Status: DC | PRN

## 2024-01-06 MED ORDER — ACETAMINOPHEN 325 MG PO TABS: 650.0000 mg | ORAL_TABLET | Freq: Four times a day (QID) | ORAL | Status: DC | PRN

## 2024-01-06 MED ORDER — IODIXANOL 320 MG/ML IV SOLN
INTRAVENOUS | Status: DC | PRN
  Administered 2024-01-06: 100 mL

## 2024-01-06 MED ORDER — SODIUM CHLORIDE 0.9 % IV SOLN: INTRAVENOUS | Status: DC

## 2024-01-06 MED ORDER — FENTANYL CITRATE (PF) 100 MCG/2ML IJ SOLN
INTRAMUSCULAR | Status: AC
  Filled 2024-01-06: qty 2

## 2024-01-06 MED ORDER — ONDANSETRON HCL 4 MG/2ML IJ SOLN: 4.0000 mg | Freq: Four times a day (QID) | INTRAMUSCULAR | Status: DC | PRN

## 2024-01-06 MED ORDER — SODIUM CHLORIDE 0.9% FLUSH
3.0000 mL | Freq: Two times a day (BID) | INTRAVENOUS | Status: DC
  Administered 2024-01-07: 3 mL via INTRAVENOUS

## 2024-01-06 MED ORDER — MORPHINE SULFATE (PF) 2 MG/ML IV SOLN
1.0000 mg | INTRAVENOUS | Status: DC | PRN
  Administered 2024-01-06: 2 mg via INTRAVENOUS
  Filled 2024-01-06: qty 1

## 2024-01-06 MED ORDER — VERAPAMIL HCL 2.5 MG/ML IV SOLN
INTRAVENOUS | Status: AC
  Filled 2024-01-06: qty 2

## 2024-01-06 MED ORDER — CHLORHEXIDINE GLUCONATE 4 % EX SOLN: 60.0000 mL | Freq: Once | CUTANEOUS | Status: DC

## 2024-01-06 MED ORDER — DEXMEDETOMIDINE HCL IN NACL 400 MCG/100ML IV SOLN
0.1000 ug/kg/h | INTRAVENOUS | Status: DC
Start: 2024-01-06 — End: 2024-01-06
  Filled 2024-01-06: qty 100

## 2024-01-06 MED ORDER — INSULIN ASPART 100 UNIT/ML IJ SOLN
0.0000 [IU] | INTRAMUSCULAR | Status: DC
  Administered 2024-01-06: 2 [IU] via SUBCUTANEOUS
  Administered 2024-01-07: 4 [IU] via SUBCUTANEOUS
  Administered 2024-01-07: 2 [IU] via SUBCUTANEOUS
  Filled 2024-01-06: qty 4
  Filled 2024-01-06 (×2): qty 2

## 2024-01-06 MED ORDER — PROPOFOL 500 MG/50ML IV EMUL
INTRAVENOUS | Status: DC | PRN
  Administered 2024-01-06: 25 ug/kg/min via INTRAVENOUS

## 2024-01-06 MED ORDER — SODIUM CHLORIDE 0.9 % IV SOLN: INTRAVENOUS | Status: AC

## 2024-01-06 MED ORDER — MIDAZOLAM HCL (PF) 2 MG/2ML IJ SOLN
INTRAMUSCULAR | Status: DC | PRN
  Administered 2024-01-06 (×2): 1 mg via INTRAVENOUS

## 2024-01-06 MED ORDER — LIDOCAINE HCL (PF) 1 % IJ SOLN
INTRAMUSCULAR | Status: DC | PRN
  Administered 2024-01-06: 2 mL via INTRADERMAL
  Administered 2024-01-06: 5 mL via INTRADERMAL

## 2024-01-06 MED ORDER — CHLORHEXIDINE GLUCONATE 0.12 % MT SOLN
15.0000 mL | Freq: Once | OROMUCOSAL | Status: AC
  Administered 2024-01-06: 15 mL via OROMUCOSAL

## 2024-01-06 MED ORDER — HEPARIN (PORCINE) IN NACL 1000-0.9 UT/500ML-% IV SOLN
INTRAVENOUS | Status: DC | PRN
  Administered 2024-01-06: 500 mL

## 2024-01-06 MED ORDER — MIDAZOLAM HCL 2 MG/2ML IJ SOLN
INTRAMUSCULAR | Status: AC
  Filled 2024-01-06: qty 2

## 2024-01-06 MED ORDER — VASOPRESSIN 20 UNITS/100 ML INFUSION FOR SHOCK
INTRAVENOUS | Status: AC
  Filled 2024-01-06: qty 100

## 2024-01-06 MED ORDER — LACTATED RINGERS IV SOLN
INTRAVENOUS | Status: DC | PRN
Start: 2024-01-06 — End: 2024-01-06

## 2024-01-06 MED ORDER — VERAPAMIL HCL 2.5 MG/ML IV SOLN
INTRAVENOUS | Status: DC | PRN
  Administered 2024-01-06: 10 mL via INTRA_ARTERIAL

## 2024-01-06 MED ORDER — LIDOCAINE HCL (PF) 1 % IJ SOLN
INTRAMUSCULAR | Status: AC
Start: 2024-01-06 — End: 2024-01-06
  Filled 2024-01-06: qty 30

## 2024-01-06 MED ORDER — SODIUM CHLORIDE 0.9 % IV SOLN
250.0000 mL | INTRAVENOUS | Status: DC | PRN
Start: 2024-01-06 — End: 2024-01-07

## 2024-01-06 MED ORDER — PROTAMINE SULFATE 10 MG/ML IV SOLN
INTRAVENOUS | Status: DC | PRN
  Administered 2024-01-06: 210 mg via INTRAVENOUS

## 2024-01-06 MED ORDER — CEFAZOLIN SODIUM-DEXTROSE 2-4 GM/100ML-% IV SOLN
2.0000 g | Freq: Three times a day (TID) | INTRAVENOUS | Status: AC
  Administered 2024-01-06 (×2): 2 g via INTRAVENOUS
  Filled 2024-01-06 (×2): qty 100

## 2024-01-06 MED ORDER — PROTAMINE SULFATE 10 MG/ML IV SOLN
INTRAVENOUS | Status: AC
  Filled 2024-01-06: qty 10

## 2024-01-06 MED ORDER — CHLORHEXIDINE GLUCONATE 4 % EX SOLN: 30.0000 mL | CUTANEOUS | Status: DC

## 2024-01-06 MED ORDER — TRAMADOL HCL 50 MG PO TABS: 50.0000 mg | ORAL_TABLET | ORAL | Status: DC | PRN

## 2024-01-06 MED ORDER — HEPARIN (PORCINE) IN NACL 2000-0.9 UNIT/L-% IV SOLN
INTRAVENOUS | Status: DC | PRN
  Administered 2024-01-06: 1000 mL

## 2024-01-06 MED ORDER — METOPROLOL TARTRATE 5 MG/5ML IV SOLN: 2.5000 mg | INTRAVENOUS | Status: DC | PRN

## 2024-01-06 MED ORDER — OXYCODONE HCL 5 MG PO TABS
5.0000 mg | ORAL_TABLET | ORAL | Status: DC | PRN
  Administered 2024-01-06 – 2024-01-07 (×4): 10 mg via ORAL
  Filled 2024-01-06 (×4): qty 2

## 2024-01-06 MED ORDER — FUROSEMIDE 10 MG/ML IJ SOLN
20.0000 mg | INTRAMUSCULAR | Status: AC
  Administered 2024-01-06: 20 mg via INTRAVENOUS
  Filled 2024-01-06: qty 2

## 2024-01-06 MED ORDER — SODIUM CHLORIDE 0.9% FLUSH: 3.0000 mL | INTRAVENOUS | Status: DC | PRN

## 2024-01-06 MED ORDER — FENTANYL CITRATE (PF) 100 MCG/2ML IJ SOLN
INTRAMUSCULAR | Status: DC | PRN
  Administered 2024-01-06: 25 ug via INTRAVENOUS
  Administered 2024-01-06: 50 ug via INTRAVENOUS
  Administered 2024-01-06 (×5): 25 ug via INTRAVENOUS

## 2024-01-06 MED ORDER — HEPARIN SODIUM (PORCINE) 1000 UNIT/ML IJ SOLN
INTRAMUSCULAR | Status: DC | PRN
  Administered 2024-01-06: 21000 [IU] via INTRAVENOUS

## 2024-01-06 MED ORDER — ACETAMINOPHEN 650 MG RE SUPP: 650.0000 mg | Freq: Four times a day (QID) | RECTAL | Status: DC | PRN

## 2024-01-06 MED ORDER — ONDANSETRON HCL 4 MG/2ML IJ SOLN
INTRAMUSCULAR | Status: DC | PRN
  Administered 2024-01-06: 4 mg via INTRAVENOUS

## 2024-01-06 NOTE — Anesthesia Postprocedure Evaluation (Signed)
 Anesthesia Post Note  Patient: Zachary Andrews  Procedure(s) Performed: Transcatheter Aortic Valve Replacement, Transfemoral (Right) ECHOCARDIOGRAM, TRANSTHORACIC     Patient location during evaluation: PACU Anesthesia Type: MAC Level of consciousness: awake and alert Pain management: pain level controlled Vital Signs Assessment: post-procedure vital signs reviewed and stable Respiratory status: spontaneous breathing, nonlabored ventilation, respiratory function stable and patient connected to nasal cannula oxygen Cardiovascular status: stable and blood pressure returned to baseline Postop Assessment: no apparent nausea or vomiting Anesthetic complications: no   There were no known notable events for this encounter.  Last Vitals:  Vitals:   01/06/24 1515 01/06/24 1530  BP: (!) 93/55 91/60  Pulse: (!) 48 (!) 105  Resp: (!) 7 (!) 8  Temp:  36.7 C  SpO2: 97% 96%    Last Pain:  Vitals:   01/06/24 1530  TempSrc: Oral  PainSc:                  [name]

## 2024-01-06 NOTE — Progress Notes (Signed)
 Site area: right femoral (venous) Site Prior to Removal:  Level 0 Pressure Applied For: 15 min Manual:   yes Patient Status During Pull:  stable Post Pull Site:  Level 0 Post Pull Instructions Given:  yes Post Pull Pulses Present: +1 DP & PT bilaterally Dressing Applied:  gauze & tegaderm Bedrest begins @ 1040 Comments:   TR BAND REMOVAL  LOCATION:  left  radial  DEFLATED PER PROTOCOL:   yes  TIME BAND OFF / DRESSING APPLIED:  1045    SITE UPON ARRIVAL:    Level 0  SITE AFTER BAND REMOVAL:    Level 0  CIRCULATION SENSATION AND MOVEMENT:    Within Normal Limits   COMMENTS:

## 2024-01-06 NOTE — Op Note (Signed)
 [address] [place] [address]             [phone]       [facility]  TAVR OPERATIVE NOTE     Date of Procedure:                01/06/2024   Preoperative Diagnosis:      Moderate Aortic Stenosis    Postoperative Diagnosis:Same and Acute on chronic diastolic heart failure   Procedure:        Transcatheter Aortic Valve Replacement - Transfemoral Approach             Edwards Sapien 3 Resilia THV (size 29 mm, model # [id number])              Co-Surgeons:                        Linnie Rayas, MD and Lurena Red, MD Anesthesiologist:                  Erma   Echocardiographer:              Croitoru   Pre-operative Echo Findings: Moderate aortic stenosis Normal left ventricular systolic function   Post-operative Echo Findings: No paravalvular leak Normal left ventricular systolic function   Left Heart Catheterization Findings: Left ventricular end-diastolic pressure of     BRIEF CLINICAL NOTE AND INDICATIONS FOR SURGERY   The patient is a 68 year old male with a history of mild coronary artery disease, paroxysmal atrial fibrillation on Eliquis , type 2 diabetes mellitus, hypertension, hyperlipidemia, and heart failure with preserved ejection fraction who was found to have developed moderate aortic stenosis with NYHA class II symptoms of shortness of breath (with CCS class 1 angina).  He is referred for elective transcatheter aortic valve replacement with a 29 mm SAPIEN 3 ultra Resilia valve from the right transfemoral approach as part of the continuing access program of the PROGRESS trial.   During the course of the patient's preoperative work up they have been evaluated comprehensively by a multidisciplinary team of specialists coordinated through the Multidisciplinary [facility] in the [facility].  They have been demonstrated to suffer from symptomatic severe aortic stenosis as noted  above. The patient has been counseled extensively as to the relative risks and benefits of all options for the treatment of severe aortic stenosis including long term medical therapy, conventional surgery for aortic valve replacement, and transcatheter aortic valve replacement.  The patient has been independently evaluated by Dr. Rayas with CT surgery and they are felt to be at high risk for conventional surgical aortic valve replacement. The surgeon indicated the patient would be a poor candidate for conventional surgery. Based upon review of all of the patient's preoperative diagnostic tests they are felt to be candidate for transcatheter aortic valve replacement using the transfemoral approach as an alternative to high risk conventional surgery.     Following the decision to proceed with transcatheter aortic valve replacement, a discussion has been held regarding what types of management strategies would be attempted intraoperatively in the event of life-threatening complications, including whether or not the patient would be considered a candidate for the use of cardiopulmonary bypass and/or conversion to open sternotomy for attempted surgical intervention.  The patient has been advised of a variety of complications that might develop peculiar to this approach including but not  limited to risks of death, stroke, paravalvular leak, aortic dissection or other major vascular complications, aortic annulus rupture, device embolization, cardiac rupture or perforation, acute myocardial infarction, arrhythmia, heart block or bradycardia requiring permanent pacemaker placement, congestive heart failure, respiratory failure, renal failure, pneumonia, infection, other late complications related to structural valve deterioration or migration, or other complications that might ultimately cause a temporary or permanent loss of functional independence or other long term morbidity.  The patient provides full informed  consent for the procedure as described and all questions were answered preoperatively.       DETAILS OF THE OPERATIVE PROCEDURE   PREPARATION:   The patient is brought to the operating room on the above mentioned date and central monitoring was established by the anesthesia team. The patient is placed in the supine position on the operating table.  Intravenous antibiotics are administered. Conscious sedation is used.    Baseline transthoracic echocardiogram was performed. The patient's chest, abdomen, both groins, and both lower extremities are prepared and draped in a sterile manner. A time out procedure is performed.     PERIPHERAL ACCESS:   Using the modified Seldinger technique, femoral arterial and venous access were obtained with placement of a 6 Fr sheath in the left radial artery and a 6 Fr sheath in the right femoral vein using u/s guidance.  A pigtail diagnostic catheter was passed through the radial arterial sheath under fluoroscopic guidance into the aortic root.     TRANSFEMORAL ACCESS:  A micropuncture kit was used to gain access to the right common femoral artery using u/s guidance. Position confirmed with angiography. Pre-closure with double ProGlide closure devices. The patient was heparinized systemically and ACT verified > 250 seconds.     A 16 Fr transfemoral E-sheath was introduced into the right common femoral artery after progressively dilating over an Amplatz superstiff wire. An AL-1 catheter was used to direct a straight-tip exchange length wire across the native aortic valve into the left ventricle. This was exchanged out for a pigtail catheter and position was confirmed in the LV apex. Simultaneous left ventricular, aortic, and left ventricular end-diastolic pressures were recorded.  The pigtail catheter was then exchanged for an Safari wire in the LV apex.  Direct LV pacing thresholds were assessed and found to be adequate.    TRANSCATHETER HEART VALVE DEPLOYMENT:  An  Edwards Sapien 3 THV (size 29 mm) was prepared and crimped per manufacturer's guidelines, and the proper orientation of the valve is confirmed on the [employer] delivery system. The valve was advanced through the introducer sheath using normal technique until in an appropriate position in the abdominal aorta beyond the sheath tip. The balloon was then retracted and using the fine-tuning wheel was centered on the valve. The valve was then advanced across the aortic arch using appropriate flexion of the catheter. The valve was carefully positioned across the aortic valve annulus. The Commander catheter was retracted using normal technique. Once final position of the valve has been confirmed by angiographic assessment, the valve is deployed while temporarily holding ventilation and during rapid ventricular pacing to maintain systolic blood pressure < 50 mmHg and pulse pressure < 10 mmHg. The balloon inflation is held for >3 seconds after reaching full deployment volume. Once the balloon has fully deflated the balloon is retracted into the ascending aorta and valve function is assessed using TTE. There is felt to be no paravalvular leak and no central aortic insufficiency.  The patient's hemodynamic recovery following valve deployment is  good.  The deployment balloon and guidewire are both removed. Echo demostrated acceptable post-procedural gradients, stable mitral valve function, and no AI.    PROCEDURE COMPLETION:  The sheath was then removed and closure devices were completed. Protamine was administered once femoral arterial repair was complete. The pigtail catheters and femoral sheaths were removed with TR band placed on the left radial artery and manual pressure used for venous hemostasis.     The patient tolerated the procedure well and is transported to the surgical intensive care in stable condition. There were no immediate intraoperative complications. All sponge instrument and needle counts are  verified correct at completion of the operation.    No blood products were administered during the operation.   The patient received a total of 100 mL of intravenous contrast during the procedure.

## 2024-01-06 NOTE — Interval H&P Note (Signed)
 History and Physical Interval Note:  01/06/2024 7:15 AM  Zachary Andrews  has presented today for surgery, with the diagnosis of Moderate Aortic Stenosis.  The various methods of treatment have been discussed with the patient and family. After consideration of risks, benefits and other options for treatment, the patient has consented to  Procedure(s): Transcatheter Aortic Valve Replacement, Transfemoral (Right) ECHOCARDIOGRAM, TRANSTHORACIC (N/A) as a surgical intervention.  The patient's history has been reviewed, patient examined, no change in status, stable for surgery.  I have reviewed the patient's chart and labs.  Questions were answered to the patient's satisfaction.     Zachary Andrews

## 2024-01-06 NOTE — Op Note (Signed)
 [facility]  TAVR OPERATIVE NOTE   Date of Procedure:  01/06/2024  Preoperative Diagnosis: Moderate Aortic Stenosis   Postoperative Diagnosis: Same and Acute on chronic diastolic heart failure  Procedure:   Transcatheter Aortic Valve Replacement - Transfemoral Approach  Edwards Sapien 3 Resilia THV (size 29 mm, model # [id number])   Co-Surgeons:  Linnie Rayas, MD and Lurena Red, MD Anesthesiologist:  Erma  Echocardiographer:  Croitoru  Pre-operative Echo Findings: Moderate aortic stenosis Normal left ventricular systolic function  Post-operative Echo Findings: No paravalvular leak Normal left ventricular systolic function  Left Heart Catheterization Findings: Left ventricular end-diastolic pressure of   BRIEF CLINICAL NOTE AND INDICATIONS FOR SURGERY  The patient is a 68 year old male with a history of mild coronary artery disease, paroxysmal atrial fibrillation on Eliquis , type 2 diabetes mellitus, hypertension, hyperlipidemia, and heart failure with preserved ejection fraction who was found to have developed moderate aortic stenosis with NYHA class II symptoms of shortness of breath (with CCS class 1 angina).  He is referred for elective transcatheter aortic valve replacement with a 29 mm SAPIEN 3 ultra Resilia valve from the right transfemoral approach as part of the continuing access program of the PROGRESS trial.  During the course of the patient's preoperative work up they have been evaluated comprehensively by a multidisciplinary team of specialists coordinated through the Multidisciplinary Heart Valve Clinic in the [facility].  They have been demonstrated to suffer from symptomatic severe aortic stenosis as noted above. The patient has been counseled extensively as to the relative risks and benefits of all options for the treatment of severe aortic stenosis including long term medical therapy, conventional surgery  for aortic valve replacement, and transcatheter aortic valve replacement.  The patient has been independently evaluated by Dr. Rayas with CT surgery and they are felt to be at high risk for conventional surgical aortic valve replacement. The surgeon indicated the patient would be a poor candidate for conventional surgery. Based upon review of all of the patient's preoperative diagnostic tests they are felt to be candidate for transcatheter aortic valve replacement using the transfemoral approach as an alternative to high risk conventional surgery.    Following the decision to proceed with transcatheter aortic valve replacement, a discussion has been held regarding what types of management strategies would be attempted intraoperatively in the event of life-threatening complications, including whether or not the patient would be considered a candidate for the use of cardiopulmonary bypass and/or conversion to open sternotomy for attempted surgical intervention.  The patient has been advised of a variety of complications that might develop peculiar to this approach including but not limited to risks of death, stroke, paravalvular leak, aortic dissection or other major vascular complications, aortic annulus rupture, device embolization, cardiac rupture or perforation, acute myocardial infarction, arrhythmia, heart block or bradycardia requiring permanent pacemaker placement, congestive heart failure, respiratory failure, renal failure, pneumonia, infection, other late complications related to structural valve deterioration or migration, or other complications that might ultimately cause a temporary or permanent loss of functional independence or other long term morbidity.  The patient provides full informed consent for the procedure as described and all questions were answered preoperatively.    DETAILS OF THE OPERATIVE PROCEDURE  PREPARATION:   The patient is brought to the operating room on the above  mentioned date and central monitoring was established by the anesthesia team. The patient is placed in the supine position on the operating table.  Intravenous antibiotics are  administered. Conscious sedation is used.   Baseline transthoracic echocardiogram was performed. The patient's chest, abdomen, both groins, and both lower extremities are prepared and draped in a sterile manner. A time out procedure is performed.   PERIPHERAL ACCESS:   Using the modified Seldinger technique, femoral arterial and venous access were obtained with placement of a 6 Fr sheath in the left radial artery and a 6 Fr sheath in the right femoral vein using u/s guidance.  A pigtail diagnostic catheter was passed through the radial arterial sheath under fluoroscopic guidance into the aortic root.    TRANSFEMORAL ACCESS:  A micropuncture kit was used to gain access to the right common femoral artery using u/s guidance. Position confirmed with angiography. Pre-closure with double ProGlide closure devices. The patient was heparinized systemically and ACT verified > 250 seconds.    A 16 Fr transfemoral E-sheath was introduced into the right common femoral artery after progressively dilating over an Amplatz superstiff wire. An AL-1 catheter was used to direct a straight-tip exchange length wire across the native aortic valve into the left ventricle. This was exchanged out for a pigtail catheter and position was confirmed in the LV apex. Simultaneous left ventricular, aortic, and left ventricular end-diastolic pressures were recorded.  The pigtail catheter was then exchanged for an Safari wire in the LV apex.  Direct LV pacing thresholds were assessed and found to be adequate.   TRANSCATHETER HEART VALVE DEPLOYMENT:  An Edwards Sapien 3 THV (size 29 mm) was prepared and crimped per manufacturer's guidelines, and the proper orientation of the valve is confirmed on the [employer] delivery system. The valve was advanced through  the introducer sheath using normal technique until in an appropriate position in the abdominal aorta beyond the sheath tip. The balloon was then retracted and using the fine-tuning wheel was centered on the valve. The valve was then advanced across the aortic arch using appropriate flexion of the catheter. The valve was carefully positioned across the aortic valve annulus. The Commander catheter was retracted using normal technique. Once final position of the valve has been confirmed by angiographic assessment, the valve is deployed while temporarily holding ventilation and during rapid ventricular pacing to maintain systolic blood pressure < 50 mmHg and pulse pressure < 10 mmHg. The balloon inflation is held for >3 seconds after reaching full deployment volume. Once the balloon has fully deflated the balloon is retracted into the ascending aorta and valve function is assessed using TTE. There is felt to be no paravalvular leak and no central aortic insufficiency.  The patient's hemodynamic recovery following valve deployment is good.  The deployment balloon and guidewire are both removed. Echo demostrated acceptable post-procedural gradients, stable mitral valve function, and no AI.   PROCEDURE COMPLETION:  The sheath was then removed and closure devices were completed. Protamine was administered once femoral arterial repair was complete. The pigtail catheters and femoral sheaths were removed with TR band placed on the left radial artery and manual pressure used for venous hemostasis.    The patient tolerated the procedure well and is transported to the surgical intensive care in stable condition. There were no immediate intraoperative complications. All sponge instrument and needle counts are verified correct at completion of the operation.   No blood products were administered during the operation.  The patient received a total of 100 mL of intravenous contrast during the procedure.  Sofija Antwi K Rebecca Cairns  MD 01/06/2024 9:39 AM

## 2024-01-06 NOTE — Transfer of Care (Signed)
 Immediate Anesthesia Transfer of Care Note  Patient: Zachary Andrews  Procedure(s) Performed: Transcatheter Aortic Valve Replacement, Transfemoral (Right) ECHOCARDIOGRAM, TRANSTHORACIC  Patient Location: Cath Lab  Anesthesia Type:MAC  Level of Consciousness: awake, alert , and oriented  Airway & Oxygen Therapy: Patient Spontanous Breathing and Patient connected to face mask oxygen  Post-op Assessment: Report given to RN, Post -op Vital signs reviewed and stable, Patient moving all extremities X 4, and Patient able to stick tongue midline  Post vital signs: Reviewed and stable  Last Vitals:  Vitals Value Taken Time  BP 108/74   Temp 98.6   Pulse 53   Resp 11   SpO2 98     Last Pain:  Vitals:   01/06/24 0747  TempSrc:   PainSc: 0-No pain         Complications: There were no known notable events for this encounter.

## 2024-01-06 NOTE — Progress Notes (Signed)
 Pt SBP >90 see flowsheet, pt drowsy, HR: >60 see flowsheet. Notified Research Officer, [employer]. Thom PA notified who came and assessed pt. Instructed to notify if provider if HR with BP was systematic. Will continue to monitor pt.

## 2024-01-06 NOTE — Anesthesia Procedure Notes (Signed)
 Procedure Name: MAC Date/Time: 01/06/2024 7:28 AM  Performed by: Harrold Macintosh, CRNAPre-anesthesia Checklist: Patient identified, Emergency Drugs available, Suction available, Patient being monitored and Timeout performed Patient Re-evaluated:Patient Re-evaluated prior to induction Oxygen Delivery Method: Simple face mask Placement Confirmation: positive ETCO2 Dental Injury: Teeth and Oropharynx as per pre-operative assessment

## 2024-01-06 NOTE — Progress Notes (Signed)
 Patient doing well s/p TAVR.  Remains in AF, groins intact, and no evidence of TIA/CVA.   Given elevated LVEDP ~55mmHg consistent with acute on chronic diastolic heart failure, will administer lasix  20mg  IV x 1.

## 2024-01-06 NOTE — Interval H&P Note (Signed)
 History and Physical Interval Note:  01/06/2024 6:50 AM  Zachary Andrews  has presented today for surgery, with the diagnosis of Moderate Aortic Stenosis.  The various methods of treatment have been discussed with the patient and family. After consideration of risks, benefits and other options for treatment, the patient has consented to  Procedure(s): Transcatheter Aortic Valve Replacement, Transfemoral (Right) ECHOCARDIOGRAM, TRANSTHORACIC (N/A) as a surgical intervention.  The patient's history has been reviewed, patient examined, no change in status, stable for surgery.  I have reviewed the patient's chart and labs.  Questions were answered to the patient's satisfaction.     [name] [name]

## 2024-01-06 NOTE — Interval H&P Note (Signed)
 History and Physical Interval Note:  01/06/2024 7:15 AM  Zachary Andrews  has presented today for surgery, with the diagnosis of Moderate Aortic Stenosis.  The various methods of treatment have been discussed with the patient and family. After consideration of risks, benefits and other options for treatment, the patient has consented to  Procedure(s): Transcatheter Aortic Valve Replacement, Transfemoral (Right) ECHOCARDIOGRAM, TRANSTHORACIC (N/A) as a surgical intervention.  The patient's history has been reviewed, patient examined, no change in status, stable for surgery.  I have reviewed the patient's chart and labs.  Questions were answered to the patient's satisfaction.     Jorie Zee MALVA Rayas

## 2024-01-06 NOTE — Progress Notes (Signed)
  Echocardiogram 2D Echocardiogram has been performed.  LAMON MAXWELL 01/06/2024, 9:02 AM

## 2024-01-07 ENCOUNTER — Inpatient Hospital Stay (HOSPITAL_COMMUNITY)

## 2024-01-07 ENCOUNTER — Other Ambulatory Visit (HOSPITAL_COMMUNITY): Payer: Self-pay

## 2024-01-07 ENCOUNTER — Encounter (HOSPITAL_COMMUNITY): Payer: Self-pay | Admitting: Internal Medicine

## 2024-01-07 DIAGNOSIS — I5033 Acute on chronic diastolic (congestive) heart failure: Secondary | ICD-10-CM | POA: Insufficient documentation

## 2024-01-07 DIAGNOSIS — Z952 Presence of prosthetic heart valve: Secondary | ICD-10-CM | POA: Diagnosis not present

## 2024-01-07 DIAGNOSIS — I358 Other nonrheumatic aortic valve disorders: Secondary | ICD-10-CM | POA: Diagnosis not present

## 2024-01-07 DIAGNOSIS — R319 Hematuria, unspecified: Secondary | ICD-10-CM | POA: Insufficient documentation

## 2024-01-07 LAB — CBC
HCT: 42.7 % (ref 39.0–52.0)
Hemoglobin: 14.3 g/dL (ref 13.0–17.0)
MCH: 30.4 pg (ref 26.0–34.0)
MCHC: 33.5 g/dL (ref 30.0–36.0)
MCV: 90.9 fL (ref 80.0–100.0)
Platelets: 139 K/uL — ABNORMAL LOW (ref 150–400)
RBC: 4.7 MIL/uL (ref 4.22–5.81)
RDW: 13.5 % (ref 11.5–15.5)
WBC: 10.3 K/uL (ref 4.0–10.5)
nRBC: 0 % (ref 0.0–0.2)

## 2024-01-07 LAB — GLUCOSE, CAPILLARY
Glucose-Capillary: 109 mg/dL — ABNORMAL HIGH (ref 70–99)
Glucose-Capillary: 128 mg/dL — ABNORMAL HIGH (ref 70–99)
Glucose-Capillary: 177 mg/dL — ABNORMAL HIGH (ref 70–99)

## 2024-01-07 LAB — BASIC METABOLIC PANEL WITH GFR
Anion gap: 10 (ref 5–15)
BUN: 22 mg/dL (ref 8–23)
CO2: 27 mmol/L (ref 22–32)
Calcium: 8.4 mg/dL — ABNORMAL LOW (ref 8.9–10.3)
Chloride: 101 mmol/L (ref 98–111)
Creatinine, Ser: 1.38 mg/dL — ABNORMAL HIGH (ref 0.61–1.24)
GFR, Estimated: 56 mL/min — ABNORMAL LOW (ref >60)
Glucose, Bld: 133 mg/dL — ABNORMAL HIGH (ref 70–99)
Potassium: 4 mmol/L (ref 3.5–5.1)
Sodium: 138 mmol/L (ref 135–145)

## 2024-01-07 LAB — ECHOCARDIOGRAM COMPLETE
AR max vel: 2.54 cm2
AV Area VTI: 2.76 cm2
AV Area mean vel: 2.76 cm2
AV Mean grad: 7 mmHg
AV Peak grad: 12.4 mmHg
Ao pk vel: 1.76 m/s
Height: 75 in
S' Lateral: 3 cm
Weight: 4980.63 [oz_av]

## 2024-01-07 LAB — MAGNESIUM: Magnesium: 1.8 mg/dL (ref 1.7–2.4)

## 2024-01-07 MED ORDER — APIXABAN 5 MG PO TABS
5.0000 mg | ORAL_TABLET | Freq: Two times a day (BID) | ORAL | 2 refills | Status: DC
  Filled 2024-01-07: qty 60, 30d supply, fill #0

## 2024-01-07 MED ORDER — PERFLUTREN LIPID MICROSPHERE
1.0000 mL | INTRAVENOUS | Status: AC | PRN
  Administered 2024-01-07: 4 mL via INTRAVENOUS

## 2024-01-07 NOTE — Progress Notes (Signed)
 Discussed with pt and wife restrictions, walking as tolerated, and CRPII. Pt receptive. Will refer to [facility] CRPII. His back limits his activity severely therefore highly encouraged CRPII. [id number] Aliene Aris BS, ACSM-CEP 01/07/2024 10:18 AM

## 2024-01-07 NOTE — Discharge Summary (Addendum)
 Discharge Summary   Patient ID: Zachary Andrews MRN: [id number]; DOB: 10-16-1955  Admit date: 01/06/2024 Discharge date: 01/07/2024  PCP:  Zachary Lamar CROME, MD   [facility] Providers Cardiologist:  Zachary Leiter, MD  Structural Heart:  Zachary MARLA Red, MD  Discharge Diagnoses  Principal Problem:   Non-rheumatic aortic sclerosis Active Problems:   Hypertension   Acute on chronic diastolic heart failure (HCC)   Hematuria   Diagnostic Studies/Procedures   TAVR OPERATIVE NOTE     Date of Procedure:                01/06/2024   Preoperative Diagnosis:      Moderate Aortic Stenosis    Postoperative Diagnosis:Same and Acute on chronic diastolic heart failure   Procedure:        Transcatheter Aortic Valve Replacement - Transfemoral Approach             Edwards Sapien 3 Resilia THV (size 29 mm, model # [id number])              Co-Surgeons:                        Zachary Rayas, MD and Zachary Red, MD Anesthesiologist:                  Zachary Andrews   Echocardiographer:              Zachary Andrews   Pre-operative Echo Findings: Moderate aortic stenosis Normal left ventricular systolic function   Post-operative Echo Findings: No paravalvular leak Normal left ventricular systolic function   Left Heart Catheterization Findings: Left ventricular end-diastolic pressure of   _____________   History of Present Illness   Zachary Andrews is a 68 y.o. male with past medical history of mild CAD, paroxysmal atrial fibrillation, diabetes, hypertension, hyperlipidemia, CKD stage II, aortic stenosis who was seen in the structural heart clinic with Dr. Red.  Underwent outpatient workup, with cardiac catheterization with minimal CAD. Seen by Zachary Andrews and deemed appropriate candidate to proceed with TAVR.  Hospital Course    Moderate aortic stenosis (NYHA II and CCS 1) -- underwent successful Edwards Sapien 3 Resilia THV (size 29 mm, model # [id number]) on 11/11 via  transfemoral approach. Echo pending official read at discharge, but viewed by MD with stable valve position.  -- instructed to resume on Eliquis  5mg  BID at discharge, no ASA   Acute on chronic diastolic heart failure -- LVEF of 65-70% -- Patient received Lasix  20 mg due to elevated LVEDP of , net - 651cc. Breathing much improved -- resume home dose PO lasix  at discharge      Hematuria -- Patient reported to Dr. Red some hematuria prior to admission.  He does have a urologist that he sees. -- it was recommended to resume on Eliquis  and if recurrent hematuria, then would address outpatient/follow up with urology.    Hypertension -- Blood pressure is well-controlled  -- Will resume home lisinopril at discharge   Hyperlipidemia -- Continue Repatha     Type 2 diabetes mellitus -- Resume Trulicity at discharge  Ordered for outpatient PT/OT at discharge   Patient was seen by Dr. Red and deemed stable for discharge home. Follow up has been arranged in the office. Medications sent to the [facility] pharmacy.   Did the patient have an acute coronary syndrome (MI, NSTEMI, STEMI, etc) this admission?:  No  Did the patient have a percutaneous coronary intervention (stent / angioplasty)?:  No.    The patient will be scheduled for a TOC follow up appointment in 10-14 days.  A message has been sent to the [facility] and Scheduling Pool at the office where the patient should be seen for follow up.  _____________  Discharge Vitals Blood pressure 127/69, pulse 82, temperature 98.5 F (36.9 C), temperature source Oral, resp. rate 15, height 6' 3 (1.905 m), weight (!) 141.2 kg, SpO2 97%.  Filed Weights   01/06/24 0552 01/06/24 1130 01/07/24 0347  Weight: (!) 140.6 kg (!) 139.5 kg (!) 141.2 kg    Labs & Radiologic Studies  CBC Recent Labs    01/06/24 0913 01/07/24 0755  WBC  --  10.3  HGB 12.6* 14.3  HCT 37.0* 42.7  MCV  --  90.9  PLT  --  139*    Basic Metabolic Panel Recent Labs    88/88/74 0913 01/07/24 0755  NA 140 138  Zachary 4.5 4.0  CL 113* 101  CO2  --  27  GLUCOSE 215* 133*  BUN 24* 22  CREATININE 1.30* 1.38*  CALCIUM  --  8.4*  MG  --  1.8   Liver Function Tests No results for input(s): AST, ALT, ALKPHOS, BILITOT, PROT, ALBUMIN in the last 72 hours. No results for input(s): LIPASE, AMYLASE in the last 72 hours. High Sensitivity Troponin:   No results for input(s): TROPONINIHS in the last 720 hours.  No results for input(s): TRNPT in the last 720 hours.  BNP Invalid input(s): POCBNP No results for input(s): PROBNP in the last 72 hours.  No results for input(s): BNP in the last 72 hours.  D-Dimer No results for input(s): DDIMER in the last 72 hours. Hemoglobin A1C No results for input(s): HGBA1C in the last 72 hours. Fasting Lipid Panel No results for input(s): CHOL, HDL, LDLCALC, TRIG, CHOLHDL, LDLDIRECT in the last 72 hours. No results found for: LIPOA  Thyroid Function Tests No results for input(s): TSH, T4TOTAL, T3FREE, THYROIDAB in the last 72 hours.  Invalid input(s): FREET3 _____________  ECHOCARDIOGRAM COMPLETE Result Date: 01/07/2024    ECHOCARDIOGRAM REPORT   Patient Name:   Zachary Andrews Date of Exam: 01/07/2024 Medical Rec #:  [id number]  Height:       75.0 in Accession #:    [id number] Weight:       311.3 lb Date of Birth:  May 08, 1955 BSA:          2.648 m Patient Age:    68 years   BP:           127/69 mmHg Patient Gender: M          HR:           85 bpm. Exam Location:  Inpatient Procedure: 2D Echo, Color Doppler, Cardiac Doppler and Intracardiac            Opacification Agent (Both Spectral and Color Flow Doppler were            utilized during procedure). Indications:    Post TAVR Evaluation z95.2  History:        Patient has prior history of Echocardiogram examinations, most                 recent 01/06/2024. Risk Factors:Diabetes, Dyslipidemia  and Sleep                 Apnea.  Aortic Valve: 29 mm Edwards Sapien prosthetic, stented (TAVR)                 valve is present in the aortic position. Procedure Date:                 01/06/24.  Sonographer:    Zachary Andrews RDCS Referring Phys: [id number] Zachary Andrews Zachary Andrews IMPRESSIONS  1. Left ventricular ejection fraction, by estimation, is 55 to 60%. The left ventricle has normal function. The left ventricle has no regional wall motion abnormalities. There is mild concentric left ventricular hypertrophy. Left ventricular diastolic parameters are indeterminate.  2. Right ventricular systolic function is normal. The right ventricular size is moderately enlarged. Tricuspid regurgitation signal is inadequate for assessing PA pressure.  3. Right atrial size was moderately dilated.  4. The mitral valve is normal in structure. No evidence of mitral valve regurgitation. No evidence of mitral stenosis.  5. The aortic valve has been repaired/replaced. Aortic valve regurgitation is not visualized. There is a 29 mm Edwards Sapien prosthetic (TAVR) valve present in the aortic position. Procedure Date: 01/06/24. Echo findings are consistent with normal structure and function of the aortic valve prosthesis. Aortic valve area, by VTI measures 2.76 cm. Aortic valve mean gradient measures 7.0 mmHg. Aortic valve Vmax measures 1.76 m/s.  6. The inferior vena cava is dilated in size with <50% respiratory variability, suggesting right atrial pressure of 15 mmHg. FINDINGS  Left Ventricle: Left ventricular ejection fraction, by estimation, is 55 to 60%. The left ventricle has normal function. The left ventricle has no regional wall motion abnormalities. Definity contrast agent was given IV to delineate the left ventricular  endocardial borders. The left ventricular internal cavity size was normal in size. There is mild concentric left ventricular hypertrophy. Left ventricular diastolic function could not be evaluated due to  atrial fibrillation. Left ventricular diastolic parameters are indeterminate. Right Ventricle: The right ventricular size is moderately enlarged. No increase in right ventricular wall thickness. Right ventricular systolic function is normal. Tricuspid regurgitation signal is inadequate for assessing PA pressure. Left Atrium: Left atrial size was normal in size. Right Atrium: Right atrial size was moderately dilated. Pericardium: Trivial pericardial effusion is present. Presence of epicardial fat layer. Mitral Valve: The mitral valve is normal in structure. No evidence of mitral valve regurgitation. No evidence of mitral valve stenosis. Tricuspid Valve: The tricuspid valve is normal in structure. Tricuspid valve regurgitation is not demonstrated. No evidence of tricuspid stenosis. Aortic Valve: The aortic valve has been repaired/replaced. Aortic valve regurgitation is not visualized. Aortic valve mean gradient measures 7.0 mmHg. Aortic valve peak gradient measures 12.4 mmHg. Aortic valve area, by VTI measures 2.76 cm. There is a 29 mm Edwards Sapien prosthetic, stented (TAVR) valve present in the aortic position. Procedure Date: 01/06/24. Echo findings are consistent with normal structure and function of the aortic valve prosthesis. Pulmonic Valve: The pulmonic valve was grossly normal. Pulmonic valve regurgitation is not visualized. No evidence of pulmonic stenosis. Aorta: The aortic root is normal in size and structure. Ascending aorta measurements are within normal limits for age when indexed to body surface area. Venous: The inferior vena cava is dilated in size with less than 50% respiratory variability, suggesting right atrial pressure of 15 mmHg. IAS/Shunts: The atrial septum is grossly normal.  LEFT VENTRICLE PLAX 2D LVIDd:         4.30 cm LVIDs:         3.00 cm LV PW:  1.40 cm LV IVS:        1.40 cm LVOT diam:     2.60 cm LV SV:         79 LV SV Index:   30 LVOT Area:     5.31 cm  RIGHT VENTRICLE RV  S prime:     9.48 cm/s TAPSE (M-mode): 2.4 cm LEFT ATRIUM             Index        RIGHT ATRIUM           Index LA diam:        3.80 cm 1.43 cm/m   RA Area:     35.80 cm LA Vol (A2C):   80.5 ml 30.40 ml/m  RA Volume:   130.00 ml 49.09 ml/m LA Vol (A4C):   80.2 ml 30.28 ml/m LA Biplane Vol: 82.1 ml 31.00 ml/m  AORTIC VALVE AV Area (Vmax):    2.54 cm AV Area (Vmean):   2.76 cm AV Area (VTI):     2.76 cm AV Vmax:           176.25 cm/s AV Vmean:          120.500 cm/s AV VTI:            0.287 m AV Peak Grad:      12.4 mmHg AV Mean Grad:      7.0 mmHg LVOT Vmax:         84.47 cm/s LVOT Vmean:        62.733 cm/s LVOT VTI:          0.149 m LVOT/AV VTI ratio: 0.52  AORTA Ao Asc diam: 4.05 cm  SHUNTS Systemic VTI:  0.15 m Systemic Diam: 2.60 cm Morene Brownie Electronically signed by Morene Brownie Signature Date/Time: 01/07/2024/11:24:26 AM    Final    ECHOCARDIOGRAM LIMITED Result Date: 01/06/2024    ECHOCARDIOGRAM LIMITED REPORT   Patient Name:   Zachary Andrews Date of Exam: 01/06/2024 Medical Rec #:  [id number]  Height:       75.0 in Accession #:    [id number] Weight:       310.0 lb Date of Birth:  01/27/1956 BSA:          2.644 m Patient Age:    68 years   BP:           169/118 mmHg Patient Gender: M          HR:           63 bpm. Exam Location:  Inpatient Procedure: Limited Echo, Color Doppler and Cardiac Doppler (Both Spectral and            Color Flow Doppler were utilized during procedure). Indications:     TAVR. Aortic valve stenosis.  History:         Patient has prior history of Echocardiogram examinations, most                  recent 10/23/2023. Chronic kidney disease; Risk                  Factors:Hypertension, Dyslipidemia, Sleep Apnea and Diabetes.                  Aortic Valve: 29 mm Edwards valve is present in the aortic                  position. Procedure Date: 01/06/24.  Sonographer:     Tinnie Barefoot  RDCS Referring Phys:  [id number] Tyja Gortney Zachary [facility] Diagnosing Phys: Jerel Balding MD   Sonographer Comments: Technically difficult study due to poor echo windows.  PRE-PROCEDURAL FINDINGS: Normal left ventricular systolic function. Estimated LVEF 65-70%. There are no regional wall motion abnormalities. Severely thickened and calcified aortic leaflets, appears consistent with at least moderate calcific aortic stenosis. Trileaflet aortic valve. Unable to get accurate aortic valve gradients in supine position. No aortic insufficiency. No mitral insufficiency. No pericardial effusion. POST-PROCEDURAL FINDINGS:  Hyperdynamic left ventricular systolic function. Estimated LVEF 75%. There are no regional wall motion abnormalities. Well-seated TAVR stent-valve. Peak aortic valve gradient 7 mm Hg, mean gradient 3 mm Hg. Dimensionless obstructive index 0.56, calculated aortic valve area is 1.76 cm (index for BSA 0.66 cm/m), acceleration time 48 ms. No aortic insufficiency/perivalvular leak. No mitral insufficiency. No pericardial effusion.  IMPRESSIONS  1. Left ventricular ejection fraction, by estimation, is 65 to 70%. The left ventricle has normal function. The left ventricle has no regional wall motion abnormalities. There is moderate concentric left ventricular hypertrophy.  2. The aortic valve has been repaired/replaced. There is a 29 mm Edwards valve present in the aortic position. Procedure Date: 01/06/24. Aortic valve mean gradient measures 3.0 mmHg. Aortic valve Vmax measures 1.29 m/s. Aortic valve acceleration time measures 95 msec. FINDINGS  Left Ventricle: Left ventricular ejection fraction, by estimation, is 65 to 70%. The left ventricle has normal function. The left ventricle has no regional wall motion abnormalities. The left ventricular internal cavity size was normal in size. There is  moderate concentric left ventricular hypertrophy. Pericardium: There is no evidence of pericardial effusion. Aortic Valve: The aortic valve has been repaired/replaced. Aortic valve mean gradient measures 3.0  mmHg. Aortic valve peak gradient measures 6.6 mmHg. Aortic valve area, by VTI measures 1.81 cm. There is a 29 mm Edwards valve present in the aortic position. Procedure Date: 01/06/24. Additional Comments: Spectral Doppler performed. Color Doppler performed.  LEFT VENTRICLE PLAX 2D LVIDd:         5.00 cm LVIDs:         3.70 cm LV PW:         1.40 cm LV IVS:        1.60 cm LVOT diam:     2.00 cm LV SV:         48 LV SV Index:   18 LVOT Area:     3.14 cm  AORTIC VALVE AV Area (Vmax):    1.62 cm AV Area (Vmean):   1.78 cm AV Area (VTI):     1.81 cm AV Vmax:           128.78 cm/s AV Vmean:          78.175 cm/s AV VTI:            0.264 m AV Peak Grad:      6.6 mmHg AV Mean Grad:      3.0 mmHg LVOT Vmax:         66.27 cm/s LVOT Vmean:        44.369 cm/s LVOT VTI:          0.152 m LVOT/AV VTI ratio: 0.58  AORTA Ao Root diam: 3.50 cm Ao Asc diam:  3.80 cm  SHUNTS Systemic VTI:  0.15 m Systemic Diam: 2.00 cm Jerel Croitoru MD Electronically signed by Jerel Balding MD Signature Date/Time: 01/06/2024/1:28:28 PM    Final    DG Chest Port 1 View Result Date: 01/06/2024 CLINICAL DATA:  Non rheumatic  aortic sclerosis EXAM: PORTABLE CHEST 1 VIEW COMPARISON:  01/02/2024 FINDINGS: TAVR noted. Heart size within normal limits for projection. The lungs appear clear. No blunting of the costophrenic angles. Lower thoracic spondylosis. IMPRESSION: 1. No acute findings. 2. TAVR noted. 3. Lower thoracic spondylosis. Electronically Signed   By: Ryan Salvage M.D.   On: 01/06/2024 13:24   Structural Heart Procedure Result Date: 01/06/2024 See surgical note for result.  DG Chest 2 View Result Date: 01/03/2024 CLINICAL DATA:  Preop exam.  Moderate aortic stenosis. EXAM: DG CHEST 2V COMPARISON:  Chest CT 12/10/2023, radiograph 08/09/2016 FINDINGS: The cardiomediastinal contours are stable. The lungs are clear. Pulmonary vasculature is normal. No consolidation, pleural effusion, or pneumothorax. No acute osseous abnormalities  are seen. IMPRESSION: No active cardiopulmonary disease. Electronically Signed   By: Andrea Gasman M.D.   On: 01/03/2024 18:54   CT CORONARY MORPH W/CTA COR W/SCORE W/CA W/CM &/OR WO/CM Addendum Date: 12/18/2023 ADDENDUM REPORT: 12/18/2023 20:48 EXAM: OVER-READ INTERPRETATION  CT CHEST The following report is an over-read performed by radiologist Dr. Oneil Devonshire of [facility] on 12/18/2023. This over-read does not include interpretation of cardiac or coronary anatomy or pathology. The coronary calcium score/coronary CTA interpretation by the cardiologist is attached. COMPARISON:  CT of the chest from earlier in the same day. FINDINGS: Cardiovascular: Mild atherosclerotic calcifications of the aorta are noted. Mild aneurysmal dilatation of the ascending aorta to 4.1 cm is noted. No dissection is seen. No evidence of pulmonary embolism is noted. Mediastinum/Nodes: There are no enlarged lymph nodes within the visualized mediastinum. Lungs/Pleura: There is no pleural effusion. The visualized lungs appear clear. Upper abdomen: No significant findings in the visualized upper abdomen. Musculoskeletal/Chest wall: Mild degenerative changes of the thoracic spine are seen. IMPRESSION: Dilatation of the ascending aorta to 4.1 cm. Recommend annual imaging followup by CTA or MRA. This recommendation follows 2010 ACCF/AHA/AATS/ACR/ASA/SCA/SCAI/SIR/STS/SVM Guidelines for the Diagnosis and Management of Patients with Thoracic Aortic Disease. Circulation. 2010; 121: Z733-z630. Aortic aneurysm NOS (ICD10-I71.9) Electronically Signed   By: Oneil Devonshire M.D.   On: 12/18/2023 20:48   Result Date: 12/18/2023 CLINICAL DATA:  Aortic Stenosis.  TAVR planning. EXAM: Cardiac TAVR CT TECHNIQUE: A non-contrast, gated CT scan was obtained with axial slices of 2.5 mm through the heart for aortic valve scoring. A 100 kV retrospective, gated, contrast cardiac scan was obtained. Gantry rotation speed was 230 msec and  collimation was 0.63 mm. Nitroglycerin  was not given. The 3D dataset was reconstructed in systole with motion correction. The 3D data set was reconstructed in 5% intervals of the 0-95% of the R-R cycle. Systolic and diastolic phases were analyzed on a dedicated workstation using MPR, MIP, and VRT modes. The patient received 100 cc of contrast. FINDINGS: Image quality: Excellent. Noise artifact is: Limited. Valve Morphology: Tricuspid aortic valve with severe calcifications and restricted leaflet motion in systole. Aortic Valve Calcium score: 2212 Aortic annular dimension: Phase assessed: 30% Annular area: 571 mm2 Annular perimeter: 85.9 mm Max diameter: 29.8 mm Min diameter: 25.2 mm Annular and subannular calcification: None. Membranous septum length: 9.0 mm Optimal coplanar projection: LAO 10 CRA 3 Coronary Artery Height above Annulus: Left Main: 14.1 mm Right Coronary: 16.9 mm Sinus of Valsalva Measurements: Non-coronary: 35 mm Right-coronary: 33 mm Left-coronary: 35 mm Sinus of Valsalva Height: Non-coronary: 23.0 mm Right-coronary: 21.9 mm Left-coronary: 19.7 mm Sinotubular Junction: 30 mm Ascending Thoracic Aorta: 40 mm Coronary Arteries: Normal coronary origin. Right dominance. The study was performed without use of NTG and  is insufficient for plaque evaluation. Please refer to recent cardiac catheterization for coronary assessment. Cardiac Morphology: Right Atrium: Right atrial size is within normal limits. Right Ventricle: The right ventricular cavity is within normal limits. Left Atrium: Left atrial size is normal in size with no left atrial appendage filling defect. Left Ventricle: The ventricular cavity size is within normal limits. Pulmonary arteries: Dilated pulmonary artery suggestive of pulmonary hypertension. Pulmonary veins: Normal pulmonary venous drainage. Pericardium: Normal thickness with no significant effusion or calcium present. Mitral Valve: The mitral valve is normal structure without  significant calcification. Extra-cardiac findings: See attached radiology report for non-cardiac structures. IMPRESSION: 1. Tricuspid aortic valve with severe calcifications. 2. Annular measurements support a 29 mm S3 or 34 mm Evolut Pro. 3. No significant annular or subannular calcifications. 4. Sufficient coronary to annulus distance. 5. Optimal Fluoroscopic Angle for Delivery: LAO 10 CRA 3 6. Dilated pulmonary artery suggestive of pulmonary hypertension. 7. Mildly dilated ascending aorta up to 40 mm. Darryle T. Barbaraann, MD Electronically Signed: By: Darryle Barbaraann M.D. On: 12/10/2023 20:43   CT ANGIO CHEST AORTA W/CM & OR WO/CM Result Date: 12/14/2023 EXAM: CTA CHEST, ABDOMEN AND PELVIS WITH AND WITHOUT CONTRAST 12/10/2023 10:39:19 AM TECHNIQUE: CTA of the chest was performed with and without the administration of intravenous contrast. CTA of the abdomen and pelvis was performed with and without the administration of intravenous contrast. 100 mL iohexol (OMNIPAQUE) 350 MG/ML injection was administered. Multiplanar reformatted images are provided for review. MIP images are provided for review. Automated exposure control, iterative reconstruction, and/or weight based adjustment of the mA/kV was utilized to reduce the radiation dose to as low as reasonably achievable. COMPARISON: None available. CLINICAL HISTORY: Aortic valve replacement (TAVR), pre-op eval. FINDINGS: VASCULATURE: AORTA: The ascending thoracic aorta is enlarging in caliber, measuring up to 4.1 cm. The descending thoracic aorta is normal in caliber. Mild atherosclerotic plaque in the thoracic aorta. Moderate atherosclerotic plaque of the abdominal aorta. No abdominal aortic aneurysm. No dissection. PULMONARY ARTERIES: No pulmonary embolism with the limits of this exam. The main pulmonary artery is normal in caliber. GREAT VESSELS OF AORTIC ARCH: No acute finding. No dissection. No arterial occlusion or significant stenosis. CELIAC TRUNK: No acute  finding. No occlusion or significant stenosis. SUPERIOR MESENTERIC ARTERY: No acute finding. No occlusion or significant stenosis. INFERIOR MESENTERIC ARTERY: No acute finding. No occlusion or significant stenosis. RENAL ARTERIES: No acute finding. No occlusion or significant stenosis. ILIAC ARTERIES: Mild-to-moderate atherosclerotic plaque in the iliac arteries. Partially visualized femoral arteries: Mild atherosclerotic plaque. CHEST: MEDIASTINUM: Aortic valve leaflet calcification. Left anterior descending coronary artery calcification. No mediastinal lymphadenopathy. The heart and pericardium demonstrate no acute abnormality. LUNGS AND PLEURA: The lungs are without acute process. No focal consolidation or pulmonary edema. No evidence of pleural effusion or pneumothorax. THORACIC BONES AND SOFT TISSUES: Bilateral shoulder degenerative changes. Multilevel degenerative changes of the spine. Kyphoplasty at the L2 level. Chronic vertebral body height loss of the L2, L3, L5 vertebral bodies. Bilateral gynecomastia. ABDOMEN AND PELVIS: LIVER: The liver is unremarkable. GALLBLADDER AND BILE DUCTS: Gallbladder is unremarkable. No biliary ductal dilatation. SPLEEN: The spleen is unremarkable. PANCREAS: The pancreas is unremarkable. ADRENAL GLANDS: Bilateral adrenal glands demonstrate no acute abnormality. KIDNEYS, URETERS AND BLADDER: Left nephrolithiasis measuring up to 9 mm. No right nephrolithiasis. No ureterolithiasis bilaterally. No hydroureteronephrosis bilaterally. Fluid density lesion within the right kidney likely represents a simple renal cyst. Simple renal cysts do not require additional follow-up unless clinically indicated due to signs or  symptoms. Urinary bladder is unremarkable. GI AND BOWEL: Colonic diverticulosis. No small or large bowel wall thickening. The appendix is not definitely identified with no inflammatory changes in the right lower quadrant to suggest acute appendicitis. Stomach and duodenal  sweep demonstrate no acute abnormality. There is no bowel obstruction. No abnormal bowel wall thickening or distension. REPRODUCTIVE: Enlarged prostate measuring up to 5.6 cm. PERITONEUM AND RETROPERITONEUM: No ascites or free air. LYMPH NODES: No lymphadenopathy. ABDOMINAL BONES AND SOFT TISSUES: Multilevel degenerative changes of the spine. Kyphoplasty at the L2 level. Chronic vertebral body height loss of the L2, L3, L5 vertebral bodies. No acute soft tissue abnormality. IMPRESSION: 1. 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 2. No abdominal aorta aneurysm/dissection. 3. Aortic valve leaflet and left anterior descending coronary artery calcifications. 4. Atherosclerotic plaque including aortic valve leaflet calcification - correlate for aortic stenosis. 5. Nonobstructive 9 mm left nephrolithiasis. 6. Prostatomegaly. 7. Colonic diverticulosis with no acute diverticulitis . Electronically signed by: Morgane Naveau MD 12/14/2023 08:50 PM EDT RP Workstation: [id number]   CT ANGIO ABDOMEN PELVIS  W & WO CONTRAST Result Date: 12/14/2023 EXAM: CTA CHEST, ABDOMEN AND PELVIS WITH AND WITHOUT CONTRAST 12/10/2023 10:39:19 AM TECHNIQUE: CTA of the chest was performed with and without the administration of intravenous contrast. CTA of the abdomen and pelvis was performed with and without the administration of intravenous contrast. 100 mL iohexol (OMNIPAQUE) 350 MG/ML injection was administered. Multiplanar reformatted images are provided for review. MIP images are provided for review. Automated exposure control, iterative reconstruction, and/or weight based adjustment of the mA/kV was utilized to reduce the radiation dose to as low as reasonably achievable. COMPARISON: None available. CLINICAL HISTORY: Aortic valve replacement (TAVR), pre-op eval. FINDINGS: VASCULATURE: AORTA: The ascending thoracic aorta is enlarging in caliber, measuring up to 4.1 cm. The descending thoracic aorta is normal in caliber. Mild  atherosclerotic plaque in the thoracic aorta. Moderate atherosclerotic plaque of the abdominal aorta. No abdominal aortic aneurysm. No dissection. PULMONARY ARTERIES: No pulmonary embolism with the limits of this exam. The main pulmonary artery is normal in caliber. GREAT VESSELS OF AORTIC ARCH: No acute finding. No dissection. No arterial occlusion or significant stenosis. CELIAC TRUNK: No acute finding. No occlusion or significant stenosis. SUPERIOR MESENTERIC ARTERY: No acute finding. No occlusion or significant stenosis. INFERIOR MESENTERIC ARTERY: No acute finding. No occlusion or significant stenosis. RENAL ARTERIES: No acute finding. No occlusion or significant stenosis. ILIAC ARTERIES: Mild-to-moderate atherosclerotic plaque in the iliac arteries. Partially visualized femoral arteries: Mild atherosclerotic plaque. CHEST: MEDIASTINUM: Aortic valve leaflet calcification. Left anterior descending coronary artery calcification. No mediastinal lymphadenopathy. The heart and pericardium demonstrate no acute abnormality. LUNGS AND PLEURA: The lungs are without acute process. No focal consolidation or pulmonary edema. No evidence of pleural effusion or pneumothorax. THORACIC BONES AND SOFT TISSUES: Bilateral shoulder degenerative changes. Multilevel degenerative changes of the spine. Kyphoplasty at the L2 level. Chronic vertebral body height loss of the L2, L3, L5 vertebral bodies. Bilateral gynecomastia. ABDOMEN AND PELVIS: LIVER: The liver is unremarkable. GALLBLADDER AND BILE DUCTS: Gallbladder is unremarkable. No biliary ductal dilatation. SPLEEN: The spleen is unremarkable. PANCREAS: The pancreas is unremarkable. ADRENAL GLANDS: Bilateral adrenal glands demonstrate no acute abnormality. KIDNEYS, URETERS AND BLADDER: Left nephrolithiasis measuring up to 9 mm. No right nephrolithiasis. No ureterolithiasis bilaterally. No hydroureteronephrosis bilaterally. Fluid density lesion within the right kidney likely  represents a simple renal cyst. Simple renal cysts do not require additional follow-up unless clinically indicated due to signs or symptoms. Urinary bladder is  unremarkable. GI AND BOWEL: Colonic diverticulosis. No small or large bowel wall thickening. The appendix is not definitely identified with no inflammatory changes in the right lower quadrant to suggest acute appendicitis. Stomach and duodenal sweep demonstrate no acute abnormality. There is no bowel obstruction. No abnormal bowel wall thickening or distension. REPRODUCTIVE: Enlarged prostate measuring up to 5.6 cm. PERITONEUM AND RETROPERITONEUM: No ascites or free air. LYMPH NODES: No lymphadenopathy. ABDOMINAL BONES AND SOFT TISSUES: Multilevel degenerative changes of the spine. Kyphoplasty at the L2 level. Chronic vertebral body height loss of the L2, L3, L5 vertebral bodies. No acute soft tissue abnormality. IMPRESSION: 1. 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 2. No abdominal aorta aneurysm/dissection. 3. Aortic valve leaflet and left anterior descending coronary artery calcifications. 4. Atherosclerotic plaque including aortic valve leaflet calcification - correlate for aortic stenosis. 5. Nonobstructive 9 mm left nephrolithiasis. 6. Prostatomegaly. 7. Colonic diverticulosis with no acute diverticulitis . Electronically signed by: Morgane Naveau MD 12/14/2023 08:50 PM EDT RP Workstation: [id number]    Disposition Pt is being discharged home today in good condition.  Follow-up Plans & Appointments  Discharge Instructions     Amb Referral to Cardiac Rehabilitation   Complete by: As directed    To [place]   Diagnosis: Valve Replacement   Valve: Aortic Comment - tavr   After initial evaluation and assessments completed: Virtual Based Care may be provided alone or in conjunction with Phase 2 Cardiac Rehab based on patient barriers.: Yes   Intensive Cardiac Rehabilitation (ICR) [facility] location only OR Traditional Cardiac Rehabilitation  (TCR) *If criteria for ICR are not met will enroll in TCR [facility] only): Yes   Diet - low sodium heart healthy   Complete by: As directed    Discharge instructions   Complete by: As directed    ACTIVITY AND EXERCISE  Daily activity and exercise are an important part of your recovery. People recover at different rates depending on their general health and type of valve procedure.  Most people recovering from TAVR feel better relatively quickly   No lifting, pushing, pulling more than 10 pounds (examples to avoid: groceries, vacuuming, gardening, golfing):             - For one week with a procedure through the groin.             - For six weeks for procedures through the chest wall or neck. NOTE: You will typically see one of our providers 7-14 days after your procedure to discuss WHEN TO RESUME the above activities.      DRIVING  Do not drive until you are seen for follow up and cleared by a provider. Generally, we ask patient to not drive for 1 week after their procedure.  If you have been told by your doctor in the past that you may not drive, you must talk with him/her before you begin driving again.   DRESSING  Groin site: you may leave the clear dressing over the site for up to one week or until it falls off.   HYGIENE  If you had a femoral (leg) procedure, you may take a shower when you return home. After the shower, pat the site dry. Do NOT use powder, oils or lotions in your groin area until the site has completely healed.  If you had a chest procedure, you may shower when you return home unless specifically instructed not to by your discharging practitioner.             -  DO NOT scrub incision; pat dry with a towel.             - DO NOT apply any lotions, oils, powders to the incision.             - No tub baths / swimming for at least 2 weeks.  If you notice any fevers, chills, increased pain, swelling, bleeding or pus, please contact your doctor.   ADDITIONAL  INFORMATION  If you are going to have an upcoming dental procedure, please contact our office as you will require antibiotics ahead of time to prevent infection on your heart valve.    If you have any questions or concerns you can call the structural heart phone during normal business hours 8am-4pm. If you have an urgent need after hours or weekends please call [phone] to talk to the on call provider for general cardiology. If you have an emergency that requires immediate attention, please call 911.    After TAVR Checklist  Check  Test Description  Follow up appointment in 1-2 weeks  You will see our structural heart physician assistant, Izetta Hummer. Your incision sites will be checked and you will be cleared to drive and resume all normal activities if you are doing well.    1 month echo and follow up  You will have an echo to check on your new heart valve and be seen back in the office by Izetta Hummer. Many times the echo is not read by your appointment time, but Izetta will call you later that day or the following day to report your results.  Follow up with your primary cardiologist You will need to be seen by your primary cardiologist in the following 3-6 months after your 1 month appointment in the valve clinic. Often times your Plavix or Aspirin  will be discontinued during this time, but this is decided on a case by case basis.   1 year echo and follow up You will have another echo to check on your heart valve after 1 year and be seen back in the office by Izetta Hummer. This your last structural heart visit.  Bacterial endocarditis prophylaxis  You will have to take antibiotics for the rest of your life before all dental procedures (even teeth cleanings) to protect your heart valve. Antibiotics are also required before some surgeries. Please check with your cardiologist before scheduling any surgeries. Also, please make sure to tell us  if you have a penicillin allergy as you will  require an alternative antibiotic.   Increase activity slowly   Complete by: As directed    No wound care   Complete by: As directed        Discharge Medications Allergies as of 01/07/2024       Reactions   Bee Venom Anaphylaxis   Atorvastatin Calcium Other (See Comments)   Muscle aches   Ezetimibe Other (See Comments)   Myalgias   Dapagliflozin    Other reaction(s): GI Upset (intolerance)   Metformin Other (See Comments)   Other reaction(s): Myalgias (intolerance) Tried on and off with same sx   Pravastatin Other (See Comments)   Muscle aches   Rosuvastatin Other (See Comments)   Muscle aches   Atorvastatin Other (See Comments)   muscle aches; myalgia   Gabapentin Other (See Comments)   confusion        Medication List     STOP taking these medications    aspirin  EC 81 MG tablet  TAKE these medications    amoxicillin  500 MG capsule Commonly known as: AMOXIL  Take 4 capsules (2,000 mg total) by mouth daily as needed. Take two hours prior to your dental work. What changed:  when to take this additional instructions   apixaban  5 MG Tabs tablet Commonly known as: ELIQUIS  Take 1 tablet (5 mg total) by mouth 2 (two) times daily.   carboxymethylcellulose 1 % ophthalmic solution Place 1 drop into both eyes daily as needed (Dry eyes).   fluticasone 50 MCG/ACT nasal spray Commonly known as: FLONASE Place 1 spray into both nostrils daily as needed for rhinitis.   furosemide  20 MG tablet Commonly known as: LASIX  Take 1 tablet (20 mg total) by mouth daily. What changed: when to take this   lisinopril 20 MG tablet Commonly known as: ZESTRIL Take 20 mg by mouth daily as needed (blood pressure above 118/60).   nitroGLYCERIN  0.4 MG SL tablet Commonly known as: NITROSTAT  Place 1 tablet (0.4 mg total) under the tongue every 5 (five) minutes as needed for chest pain.   NON FORMULARY Pt uses a cpap nightly   pregabalin 50 MG capsule Commonly known as:  LYRICA Take 50 mg by mouth at bedtime.   Repatha  SureClick 140 MG/ML Soaj Generic drug: Evolocumab  Inject 1 pen into the skin every 14 (fourteen) days.   Trulicity 4.5 MG/0.5ML Soaj Generic drug: Dulaglutide Inject 4.5 mg into the skin every 7 (seven) days.   vitamin C 1000 MG tablet Take 2,000 mg by mouth daily.         Outstanding Labs/Studies  Outpatient echo in one month  Duration of Discharge Encounter: APP Time: 15 minutes   Signed, Manuelita Rummer, NP 01/07/2024, 11:32 AM   ATTENDING ATTESTATION:  After conducting a review of all available clinical information with the care team, interviewing the patient, and performing a physical exam, I agree with the findings and plan described in this note.   GEN: No acute distress, AO x 3 HEENT:  MMM, no JVD, no scleral icterus Cardiac: RRR, no murmurs, rubs, or gallops.  Respiratory: Clear to auscultation bilaterally. GI: Soft, nontender, non-distended  MS: No edema; No deformity. Neuro:  Nonfocal  Vasc:  +2 radial pulses; intact right common femoral artery access site and left radial access site  Patient doing well after uncomplicated TAVR for moderate aortic stenosis.  The patient has NYHA class II symptoms and CCS class I angina.  His procedure was uncomplicated.  His access sites are stable.  He is also found to be in acute on chronic diastolic heart failure and received Lasix  20 mg IV x 1 due to elevated LVEDP.  The patient has a history of atrial fibrillation and has been on Eliquis  until recently.  He had hematuria but this is resolved since being off the medication.  Advised him to restart the Eliquis  at discharge.  If recurrent hematuria occurs we will arrange for him to be seen by his urologist.  Echocardiogram today was reassuring.  Discharge today with close hospital follow-up.  APP discharge time:15 MD discharge time:20  Zachary Red, MD Pager [phone]

## 2024-01-07 NOTE — Progress Notes (Signed)
 TOC pharmacy meds given to wife at bedside.    Med details completed.  Estella RN will discharge patient.  Wife at bedside.

## 2024-01-07 NOTE — Progress Notes (Signed)
 Echocardiogram 2D Echocardiogram has been performed.  Damien FALCON Ustin Cruickshank RDCS 01/07/2024, 9:54 AM

## 2024-01-07 NOTE — Progress Notes (Signed)
  Progress Note  Patient Name: Zachary Andrews Date of Encounter: 01/07/2024 [facility] Cardiologist: Monetta  Interval Summary   POD1 after uncomplicated TAVR for moderate aortic stenosis  Received Lasix  20 mg IV x 1 given elevated LVEDP during procedure  No acute events overnight.  Right groin and left radial access sites remain intact.  No signs of stroke  Vital Signs Vitals:   01/06/24 1948 01/06/24 2325 01/07/24 0258 01/07/24 0347  BP: 108/71 113/70 117/80   Pulse: 75 83 72 72  Resp: 20 20 20 10   Temp: 98.5 F (36.9 C) (!) 97 F (36.1 C) 99.2 F (37.3 C)   TempSrc: Oral Oral Oral   SpO2: 98% 97% 96% 96%  Weight:    (!) 141.2 kg  Height:        Intake/Output Summary (Last 24 hours) at 01/07/2024 0710 Last data filed at 01/07/2024 0259 Gross per 24 hour  Intake 958.84 ml  Output 510 ml  Net 448.84 ml      01/07/2024    3:47 AM 01/06/2024   11:30 AM 01/06/2024    5:52 AM  Last 3 Weights  Weight (lbs) 311 lb 4.6 oz 307 lb 8.7 oz 310 lb  Weight (kg) 141.2 kg 139.5 kg 140.615 kg      Telemetry/ECG  Atrial fibrillation- Personally Reviewed  Physical Exam  GEN: No acute distress.   Neck: No JVD Cardiac: Irregular RRR, no murmurs, rubs, or gallops.  Respiratory: Clear to auscultation bilaterally. GI: Soft, nontender, non-distended  MS: No edema  Assessment & Plan  Moderate aortic stenosis: NYHA II and CCS 1.  Will follow-up echocardiogram.  Will plan on resuming home Eliquis  5 mg twice daily; follow-up laboratories this morning.  Acute on chronic diastolic heart failure: Patient received Lasix  20 mg due to elevated LVEDP.  Patient's breathing is much improved.  Hematuria: Patient tells me that he had some hematuria prior to admission.  He does have a urologist that he sees.  I think it best that he restart Eliquis  depending on his laboratories today.  If he has recurrent hematuria this can be evaluated as an outpatient.  Hypertension: Blood  pressure is well-controlled today.  Will resume home lisinopril at discharge  Hyperlipidemia: Continue Repatha  on discharge  Type 2 diabetes mellitus: Resume Trulicity at discharge     For questions or updates, please contact [facility] Please consult [email or web address] for contact info under         Signed, Yacoub Diltz K Lianny Molter, MD

## 2024-01-07 NOTE — Evaluation (Signed)
 Physical Therapy Evaluation Patient Details Name: Zachary Andrews MRN: [id number] DOB: 1955-05-30 Today's Date: 01/07/2024  History of Present Illness  68 y/o M s/p TAVR via transfemoral approach on 11/11. PMH includes HTN, back surgery, L hip OA, frequent falls, obesity, neuropathy, HLD  Clinical Impression  Pt is presenting at Min A for sit to stand from recliner and EOB (elevated), CGA for gait with RW. Attempted gait with rollator and was very unsafe due to moderate UE support on RW due to balance, strength and endurance deficits. Pt has assistance from significant other at home which she has been providing previously. Due to pt current functional status, home set up and available assistance at home recommending skilled physical therapy services 3x/week in order to address strength, balance and functional mobility to decrease risk for falls, injury and re-hospitalization. Pt is close to baseline. Will currently sign off on physical therapy services in acute hospital setting at this time. Please re-consult if further needs arise.          If plan is discharge home, recommend the following: Help with stairs or ramp for entrance;A little help with walking and/or transfers;Assistance with cooking/housework;Assist for transportation     Equipment Recommendations None recommended by PT     Functional Status Assessment Patient has had a recent decline in their functional status and demonstrates the ability to make significant improvements in function in a reasonable and predictable amount of time.     Precautions / Restrictions Precautions Precautions: Fall Precaution/Restrictions Comments: L radial access site Restrictions Weight Bearing Restrictions Per Provider Order: No      Mobility  Bed Mobility     General bed mobility comments: pt sitting in recliner at beginning of session and EOB at end of session    Transfers Overall transfer level: Needs assistance Equipment used: Rolling  walker (2 wheels), Rollator (4 wheels) Transfers: Sit to/from Stand Sit to Stand: Min assist           General transfer comment: Min A for sit to stand with RW and rollator. Significant other is able to assist and has gait belt to assist. Pt has elevated bed and lift recliner at home. Verbal cues for body mechanics and hand placement in order to decrease need for physical assist    Ambulation/Gait Ambulation/Gait assistance: Contact guard assist Gait Distance (Feet): 200 Feet Assistive device: Rolling walker (2 wheels), Rollator (4 wheels) Gait Pattern/deviations: Step-through pattern, Decreased stance time - left, Antalgic, Trunk flexed, Wide base of support Gait velocity: decreased Gait velocity interpretation: <1.8 ft/sec, indicate of risk for recurrent falls   General Gait Details: Pt with antalgic gait due to sciatic pain intermittently. Flexed trunk unable to stand upright due to back pain and previous back surgeries. Attempted with rollator on pt request and very unstable due to pt is moderately utilizing RW for balance and support during gait. pt states that he does not feel safe with rollator and will continue with RW at home.  Stairs Stairs:  (no stairs)         Balance Overall balance assessment: Needs assistance Sitting-balance support: Feet supported Sitting balance-Leahy Scale: Good     Standing balance support: During functional activity, Reliant on assistive device for balance Standing balance-Leahy Scale: Poor Standing balance comment: moderate assist from RW for balance during functional activities       Pertinent Vitals/Pain Pain Assessment Pain Assessment: Faces Faces Pain Scale: Hurts little more Pain Location: back and LLE (Sciatica) Pain Descriptors / Indicators: Discomfort, Guarding  Pain Intervention(s): Monitored during session, Limited activity within patient's tolerance    Home Living Family/patient expects to be discharged to:: Private  residence Living Arrangements: Spouse/significant other   Type of Home: House Home Access: Level entry       Home Layout: One level Home Equipment: Grab bars - toilet;Grab bars - tub/shower;Rolling Walker (2 wheels);Cane - single point;BSC/3in1      Prior Function Prior Level of Function : Needs assist             Mobility Comments: cane and RW for mobility ADLs Comments: assist for LB ADLs, assist to get in shower     Extremity/Trunk Assessment   Upper Extremity Assessment Upper Extremity Assessment: Defer to OT evaluation    Lower Extremity Assessment Lower Extremity Assessment: Generalized weakness    Cervical / Trunk Assessment Cervical / Trunk Assessment: Other exceptions Cervical / Trunk Exceptions: previous back surgeries and back pain  Communication   Communication Communication: No apparent difficulties Factors Affecting Communication: Hearing impaired    Cognition Arousal: Alert Behavior During Therapy: WFL for tasks assessed/performed   PT - Cognitive impairments: No apparent impairments     Following commands: Intact       Cueing Cueing Techniques: Verbal cues     General Comments General comments (skin integrity, edema, etc.): Incisions clean dry and intact. No drainge noted. Pt HR up to 149 bpm during activity especially with sit to stand with cues to breathe        Assessment/Plan    PT Assessment All further PT needs can be met in the next venue of care  PT Problem List Cardiopulmonary status limiting activity;Decreased activity tolerance;Decreased balance;Decreased mobility;Decreased strength;Decreased safety awareness       PT Treatment Interventions      PT Goals (Current goals can be found in the Care Plan section)  Acute Rehab PT Goals Patient Stated Goal: to improve endurance PT Goal Formulation: With patient/family Time For Goal Achievement: 01/21/24 Potential to Achieve Goals: Good     AM-PAC PT 6 Clicks Mobility   Outcome Measure Help needed turning from your back to your side while in a flat bed without using bedrails?: A Lot Help needed moving from lying on your back to sitting on the side of a flat bed without using bedrails?: A Lot Help needed moving to and from a bed to a chair (including a wheelchair)?: A Little Help needed standing up from a chair using your arms (e.g., wheelchair or bedside chair)?: A Little Help needed to walk in hospital room?: A Little Help needed climbing 3-5 steps with a railing? : A Lot 6 Click Score: 15    End of Session Equipment Utilized During Treatment: Gait belt Activity Tolerance: Patient tolerated treatment well Patient left: in bed;with call bell/phone within reach;with family/visitor present Nurse Communication: Mobility status PT Visit Diagnosis: Unsteadiness on feet (R26.81);Other abnormalities of gait and mobility (R26.89)    Time: 8841-8775 PT Time Calculation (min) (ACUTE ONLY): 26 min   Charges:   PT Evaluation $PT Eval Low Complexity: 1 Low PT Treatments $Therapeutic Activity: 8-22 mins PT General Charges $$ ACUTE PT VISIT: 1 Visit        Dorothyann Maier, DPT, CLT  Acute Rehabilitation Services Office: [phone] (Secure chat preferred)   Dorothyann VEAR Maier 01/07/2024, 1:03 PM

## 2024-01-07 NOTE — Evaluation (Signed)
 Occupational Therapy Evaluation Patient Details Name: Zachary Andrews MRN: [id number] DOB: May 31, 1955 Today's Date: 01/07/2024   History of Present Illness   68 y/o M s/p TAVR via transfemoral approach on 11/11. PMH includes HTN, back surgery, L hip OA, frequent falls, obesity, neuropathy, HLD     Clinical Impressions Pt has assist at baseline with ADLs and uses RW/cane for mobility, lives with spouse who assists him with ADLs. Pt currently needs up to mod A for ADLs, mod A for bed mobility and min A for transfers with RW. Pt educated about LUE precautions from radial access site. Pt tachy to 145bpm with hallway distance ambulation. Pt presenting with impairments listed below, will follow acutely. Recommend HHOT at d/c.      If plan is discharge home, recommend the following:   A little help with walking and/or transfers;A lot of help with bathing/dressing/bathroom;Assistance with cooking/housework;Assist for transportation     Functional Status Assessment   Patient has had a recent decline in their functional status and demonstrates the ability to make significant improvements in function in a reasonable and predictable amount of time.     Equipment Recommendations   Tub/shower seat;Other (comment) (rollator (bari))     Recommendations for Other Services   PT consult     Precautions/Restrictions   Precautions Precautions: Fall Precaution/Restrictions Comments: L radial access site Restrictions Weight Bearing Restrictions Per Provider Order: No     Mobility Bed Mobility Overal bed mobility: Needs Assistance Bed Mobility: Sidelying to Sit   Sidelying to sit: Mod assist       General bed mobility comments: mod A for trunk elevation    Transfers Overall transfer level: Needs assistance Equipment used: Rolling walker (2 wheels) Transfers: Sit to/from Stand Sit to Stand: Min assist                  Balance Overall balance assessment: Needs  assistance Sitting-balance support: Feet supported Sitting balance-Leahy Scale: Good     Standing balance support: During functional activity, Reliant on assistive device for balance Standing balance-Leahy Scale: Poor Standing balance comment: reliant on external support                           ADL either performed or assessed with clinical judgement   ADL Overall ADL's : Needs assistance/impaired Eating/Feeding: Set up   Grooming: Set up   Upper Body Bathing: Minimal assistance   Lower Body Bathing: Moderate assistance   Upper Body Dressing : Minimal assistance   Lower Body Dressing: Moderate assistance   Toilet Transfer: Minimal assistance;Ambulation;Rolling walker (2 wheels)   Toileting- Clothing Manipulation and Hygiene: Minimal assistance       Functional mobility during ADLs: Minimal assistance;Rolling walker (2 wheels)       Vision   Vision Assessment?: No apparent visual deficits     Perception Perception: Not tested       Praxis Praxis: Not tested       Pertinent Vitals/Pain Pain Assessment Pain Assessment: Faces Pain Score: 7  Faces Pain Scale: Hurts whole lot Pain Location: back and LLE (Sciatica) Pain Descriptors / Indicators: Discomfort Pain Intervention(s): Limited activity within patient's tolerance, Monitored during session, Repositioned     Extremity/Trunk Assessment Upper Extremity Assessment Upper Extremity Assessment: Generalized weakness   Lower Extremity Assessment Lower Extremity Assessment: Defer to PT evaluation   Cervical / Trunk Assessment Cervical / Trunk Assessment: Other exceptions (back pain)   Communication Communication Communication: Impaired Factors Affecting  Communication: Hearing impaired   Cognition Arousal: Alert Behavior During Therapy: WFL for tasks assessed/performed Cognition: No apparent impairments                               Following commands: Intact        Cueing  General Comments   Cueing Techniques: Verbal cues  HR tachy to 145bpm with ambulation, R  groin and L radial site CDI   Exercises     Shoulder Instructions      Home Living Family/patient expects to be discharged to:: Private residence Living Arrangements: Spouse/significant other   Type of Home: House Home Access: Level entry     Home Layout: One level     Bathroom Shower/Tub: Producer, Television/film/video: Handicapped height     Home Equipment: Grab bars - toilet;Grab bars - tub/shower;Rolling Walker (2 wheels);Cane - single point;BSC/3in1          Prior Functioning/Environment Prior Level of Function : Needs assist             Mobility Comments: cane and RW for mobility ADLs Comments: assist for LB ADLs, assist to get in shower    OT Problem List: Decreased strength;Impaired balance (sitting and/or standing);Decreased activity tolerance;Decreased range of motion;Impaired vision/perception;Decreased safety awareness;Impaired UE functional use;Cardiopulmonary status limiting activity   OT Treatment/Interventions: Self-care/ADL training;Therapeutic exercise;Energy conservation;DME and/or AE instruction;Therapeutic activities;Patient/family education;Balance training      OT Goals(Current goals can be found in the care plan section)   Acute Rehab OT Goals Patient Stated Goal: none stated OT Goal Formulation: With patient/family Time For Goal Achievement: 01/21/24 Potential to Achieve Goals: Good ADL Goals Pt Will Perform Upper Body Dressing: with modified independence;sitting Pt Will Perform Lower Body Dressing: with modified independence;sitting/lateral leans;sit to/from stand Pt Will Transfer to Toilet: with modified independence;ambulating;regular height toilet Pt Will Perform Tub/Shower Transfer: Shower transfer;with modified independence;ambulating;rolling walker;shower seat   OT Frequency:  Min 2X/week    Co-evaluation               AM-PAC OT 6 Clicks Daily Activity     Outcome Measure Help from another person eating meals?: A Little Help from another person taking care of personal grooming?: A Little Help from another person toileting, which includes using toliet, bedpan, or urinal?: A Little Help from another person bathing (including washing, rinsing, drying)?: A Lot Help from another person to put on and taking off regular upper body clothing?: A Little Help from another person to put on and taking off regular lower body clothing?: A Lot 6 Click Score: 16   End of Session Equipment Utilized During Treatment: Gait belt;Rolling walker (2 wheels) Nurse Communication: Mobility status  Activity Tolerance: Patient tolerated treatment well Patient left: in chair;with call bell/phone within reach;with family/visitor present  OT Visit Diagnosis: Unsteadiness on feet (R26.81);Other abnormalities of gait and mobility (R26.89);Muscle weakness (generalized) (M62.81)                Time: 1010-1039 OT Time Calculation (min): 29 min Charges:  OT General Charges $OT Visit: 1 Visit OT Evaluation $OT Eval Moderate Complexity: 1 Mod OT Treatments $Self Care/Home Management : 8-22 mins  Ikia Cincotta K, OTD, OTR/L SecureChat Preferred Acute Rehab [phone]   Makani Seckman K Koonce 01/07/2024, 10:50 AM

## 2024-01-08 ENCOUNTER — Telehealth: Payer: Self-pay

## 2024-01-08 NOTE — Progress Notes (Addendum)
 [facility] Transitional Care Management     Situation   Zachary Andrews is a 68 y.o. male who was contacted today for a transitional care outreach.  Admission Date: 01/06/24  Discharge Date:01/07/24    Institution: [facility]   Diagnosis:  S/P TAVR  Is this visit eligible for TCM? Yes  Background  01/08/24 Initial TCM outreach x 1st attempt. HN called patient home/mobile # [phone] and left message to return my phone call and also left reminder of apt with Dr. Ofilia today 01/08/24 @ 2:00 pm    01/09/24 Initial TCM outreach x 2nd attempt. HN called patient home/mobile # [phone] and left message for patient to return my phone call or call PCP office. Patient seen PCP in office yesterday and has apt with Cardiology on 01/12/24 @ 11:35 am.   01/15/24 Day 9 HN called patient home/mobile # [phone] and left message for patient to return my phone call or call PCP office if any new issues or concerns.    02/02/24 Day 27 HN called patient home/mobile # [phone] and left message for patient to return my phone call or call PCP office if any new issues or concerns.   Since Discharge: HN received notification that patient was discharged from [facility] on 01/07/24 for planned surgery for TAVR.  HN was unable to reach patient for hospital follow up. Patient did follow up with PCP. No further HN services warranted at this time.    I reviewed current EMR med list and facility discharge medications. Please see note in EMR med list for findings.  Stopped: Aspirin  81 mg 1 po every day   New Medicines: Eliquis  5 mg 1 po bid     Primary Care Provider on Record: Lamar LITTIE Ofilia, MD   Assessment    General Assessment     None           Recommendation    PCP/specialist notified: Yes  Referral Made: No  Referrals made to other disciplines: None   Future Appointments  Date Time Provider Department Center  01/08/2024  2:00 PM Lamar LITTIE Ofilia, MD [facility] [address]   02/11/2024  8:30 AM Lamar LITTIE Ofilia, MD [facility] [address]      Jon Sharps, RN [place] Navigator [phone]   Electronically signed by: Jon Earnie Sharps, RN 01/08/2024 11:34 AM

## 2024-01-08 NOTE — Telephone Encounter (Signed)
 Patient contacted regarding discharge from [facility] on 01/07/24  Patient understands to follow up with provider Izetta Hummer, PA-C on 01/12/24 at 11:35AM at [address] Location. Patient understands discharge instructions? Yes Patient understands medications and regiment? Yes Patient understands to bring all medications to this visit? Yes  I spoke with the patient's wife and she is concerned because the patient cannot move his legs well since his hospital stay. She says that he was able to walk with the nurse twice post-TAVR and prior to his discharge, but he was unable to get into the car by himself leaving the hospital and needed assistance getting out of the car and to his home as well. The patient is seeing his PCP today.

## 2024-01-12 ENCOUNTER — Ambulatory Visit: Admitting: Physician Assistant

## 2024-01-12 NOTE — Progress Notes (Deleted)
 [facility]   MULTIDISCIPLINARY HEART VALVE CLINIC                                     Cardiology Office Note:    Date:  01/12/2024   ID:  Zachary Andrews, DOB 1956-01-04, MRN [id number]  PCP:  Ofilia Lamar CROME, MD  [facility] Cardiologist:  Redell Leiter, MD  [facility] Structural heart: Lurena MARLA Red, MD [facility] Electrophysiologist:  None   Referring MD: Ofilia Lamar CROME, MD   [facility] s/p TAVR (through Prog CAP trial)  History of Present Illness:    Zachary Andrews is a 68 y.o. male with a hx of CAD, PAF on Eliquis , DMT2, HTN, HLD, CKD II, HFpEF (elevated filling pressure noted on cath and Lasix  increased) and moderate AS s/p TAVR (01/06/24) through Progress CAP trial who presents to clinic for follow up.    : EF 60%, mod LVH, moderate AS w/ mean grad 31.0 mmHg, Vmax 2.94 m/s, AVA 1.07 cm2, DVI 0.39, SVI 34, BSA: 2.7 m2 (BMI: 40).  Cath: Minimal, nonobstructive coronary artery disease. Fick cardiac output of 9.9 L/min and Fick cardiac index of 3.8 L/min/m with the following hemodynamics: RAP mean of 15 mmHg, RV pressure 37/8 with EDP of 16 mmHg, Wedge pressure mean of 23 mmHg V waves to 28 mmHg, PA pressure of 32/26 with a mean of 29 mmHg, PVR of 0.6 Woods units, PA pulsatility necks of 0.4. Capacious iliofemoral vessels bilaterally. Given elevated filling pressures and low PA pulsatility index Lasix  increased.    Past Medical History:  Diagnosis Date   Abdominal aortic atherosclerosis 05/19/2020   05/19/2020: on CT     Acute non-recurrent sinusitis 07/09/2023   07/09/2023     Acute renal insufficiency 09/16/2019   Aortic stenosis, moderate 09/09/2017   Formatting of this note might be different from the original.  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   Carbuncle 08/20/2023   08/20/2023: right axilla     Carotid stenosis, right 09/09/2017   Carpal tunnel syndrome,  bilateral 06/01/2015   Central spinal stenosis 12/21/2016   Chronic bilateral low back pain with left-sided sciatica 07/11/2015   Coronary artery disease involving native coronary artery of native heart 12/21/2016   Deviated nasal septum 01/31/2022   01/31/2022, rightward     Elevated LFTs 08/22/2017   ICD-10 cut over   Frequent falls 10/15/2016   H/O spinal stenosis 08/22/2017   Hepatic steatosis 05/19/2020   05/19/2020: on CT     History of cigarette smoking 05/27/2022   Last 2014, 44 pack years     History of pulmonary embolism 09/21/2019   Hypertension 02/06/2015   Hypertensive heart disease 02/06/2015   Moderate LVH     Hypotension 09/16/2019   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   02/07/2022:     Ingrown nail 09/29/2021   Intervertebral disc disorder with radiculopathy of lumbosacral region 10/04/2014   Ischial bursitis 08/03/2019   Lumbar disc disease 08/22/2017   Lumbar radiculopathy 12/25/2015   Lumbar spondylosis 04/29/2016   Lumbar stenosis with neurogenic claudication 12/21/2016   Microalbuminuria 08/22/2017   Mixed hyperlipidemia 03/13/2015   Refusing statin   Nephrolithiasis 12/21/2016   Neuropathy 08/19/2017   From cervical and lumbar spinal stenosis  And carpal tunnel   Non-recurrent unilateral inguinal hernia without  obstruction or gangrene 12/23/2016   2018   Numbness 02/06/2015   Obesity 08/22/2017   Obstructive sleep apnea on CPAP 04/10/2016   Managed PULM   Occlusion of right internal carotid artery 12/21/2016   >70%   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   Osteoarthritis of shoulder 12/21/2016   Pain around toenail, right foot 09/29/2021   Pain in left knee 07/08/2017   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 2 diabetes mellitus (HCC) 09/13/2021   Polyp of colon 12/21/2016   Primary osteoarthritis of left hip 11/19/2016   Pulmonary embolism (HCC) 09/21/2019   Rib pain on left side 05/19/2020   05/19/2020:     Risk for falls 04/16/2021    04/16/2021 : high risk, is falling, will not use walker; has poor core   strength and foot neuropathy; declined PT, etc; risk discussed     Sensorineural hearing loss (SNHL) of both ears 04/20/2018   Sexual dysfunction 08/22/2017   Spinal cord compression (HCC) 02/06/2015   Stage 2 chronic kidney disease 09/10/2017   Stage 3a chronic kidney disease (HCC) 10/23/2021   10/23/2021: 56, stable, HTN/DM/VASC     Surgical wound present 12/02/2022   12/02/2022 : LSS     Suspected severe acute respiratory syndrome coronavirus 2 (SARS-CoV-2) infection 09/16/2019   Symptoms involving cardiovascular system 01/12/2019   Tinea unguium 12/21/2016   Tinnitus of both ears 04/20/2018   Type 2 diabetes mellitus without complication, without long-term current use of insulin (HCC) 04/10/2016   Vitamin D deficiency 04/10/2016     Current Medications: No outpatient medications have been marked as taking for the 01/12/24 encounter (Appointment) with Sebastian Lamarr SAUNDERS, PA-C.      ROS:   Please see the history of present illness.    All other systems reviewed and are negative.  EKGs       Risk Assessment/Calculations:   {Does this patient have ATRIAL FIBRILLATION?:[phone]}   {This patient may be at risk for Amyloid. He has one or more dx on the prob list or PMH from the following list -  Abnormal EKG, HFpEF/Diastolic CHF, Aortic Stenosis, LVH, Bilateral Carpal Tunnel Syndrome, Biceps Tendon Rupture, Spinal Stenosis, Pericardial Effusion, Left Atrial Enlargement, Conduction System Disorder. See list below or review PMH.  Diagnoses From Problem List           Noted     Acute on chronic diastolic heart failure (HCC) 01/07/2024     Aortic stenosis, moderate 09/09/2017     Carpal tunnel syndrome, bilateral 06/01/2015     Central spinal stenosis 12/21/2016     Chronic bilateral low back pain with left-sided sciatica 07/11/2015     Intervertebral disc disorder with radiculopathy of lumbosacral region  10/04/2014     Lumbar radiculopathy 12/25/2015     Lumbar stenosis with neurogenic claudication 12/21/2016     Neuropathy 08/19/2017     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 2 diabetes mellitus (HCC) 09/13/2021    Click HERE to open Cardiac Amyloid Screening SmartSet to order screening OR Click HERE to defer testing for 1 year or permanently :1}    Physical Exam:    VS:  There were no vitals taken for this visit.    Wt Readings from Last 3 Encounters:  01/07/24 (!) 311 lb 4.6 oz (141.2 kg)  12/19/23 (!) 310 lb (140.6 kg)  12/02/23 (!) 307 lb 9.6 oz (139.5 kg)     GEN: Well nourished, well developed in no acute distress NECK: No JVD CARDIAC: ***RRR, no murmurs,  rubs, gallops RESPIRATORY:  Clear to auscultation without rales, wheezing or rhonchi  ABDOMEN: Soft, non-tender, non-distended EXTREMITIES:  No edema; No deformity.  Groin sites clear without hematoma or ecchymosis. ****  ASSESSMENT:    No diagnosis found.  PLAN:    In order of problems listed above:  Severe AS s/p TAVR:  -- Pt doing *** s/p TAVR.  -- ECG with no HAVB.  -- Groin sites healing well.  -- SBE***.  -- Continue Aspirin  81mg  daily.*** -- Cleared to resume all activities without restriction. -- I will see back for 1 month echo and OV.  Severe AS s/p TAVR:  -- Echo today shows EF ***, normally functioning TAVR with a mean gradient of *** mm hg and *** PVL.  -- NYHA class *** symptoms.  -- Continue ***.  -- SBE*** -- I will see back for 1 month/year office visit with echo.         {Are you ordering a CV Procedure (e.g. stress test, cath, DCCV, TEE, etc)?   Press F2        :[id number]}     Medication Adjustments/Labs and Tests Ordered: Current medicines are reviewed at length with the patient today.  Concerns regarding medicines are outlined above.  No orders of the defined types were placed in this encounter.  No orders of the defined types were placed in this encounter.   There are no Patient  Instructions on file for this visit.   Signed, Lamarr Hummer, PA-C  01/12/2024 9:18 AM    [facility]

## 2024-01-13 ENCOUNTER — Telehealth (HOSPITAL_COMMUNITY): Payer: Self-pay

## 2024-01-13 NOTE — Telephone Encounter (Signed)
 Faxed outside referral for Phase II Cardiac Rehab to [facility] in [place].

## 2024-01-19 ENCOUNTER — Telehealth: Payer: Self-pay

## 2024-01-19 ENCOUNTER — Ambulatory Visit: Attending: Physician Assistant | Admitting: Physician Assistant

## 2024-01-19 VITALS — BP 124/76 | HR 81 | Ht 74.0 in | Wt 308.1 lb

## 2024-01-19 DIAGNOSIS — R319 Hematuria, unspecified: Secondary | ICD-10-CM | POA: Diagnosis not present

## 2024-01-19 DIAGNOSIS — Z952 Presence of prosthetic heart valve: Secondary | ICD-10-CM

## 2024-01-19 DIAGNOSIS — E785 Hyperlipidemia, unspecified: Secondary | ICD-10-CM

## 2024-01-19 DIAGNOSIS — I5032 Chronic diastolic (congestive) heart failure: Secondary | ICD-10-CM

## 2024-01-19 DIAGNOSIS — E1169 Type 2 diabetes mellitus with other specified complication: Secondary | ICD-10-CM | POA: Diagnosis not present

## 2024-01-19 DIAGNOSIS — E1159 Type 2 diabetes mellitus with other circulatory complications: Secondary | ICD-10-CM | POA: Diagnosis not present

## 2024-01-19 DIAGNOSIS — I152 Hypertension secondary to endocrine disorders: Secondary | ICD-10-CM

## 2024-01-19 DIAGNOSIS — I480 Paroxysmal atrial fibrillation: Secondary | ICD-10-CM

## 2024-01-19 MED ORDER — AMOXICILLIN 500 MG PO TABS: 2000.0000 mg | ORAL_TABLET | ORAL | 12 refills | Status: AC

## 2024-01-19 MED ORDER — ASPIRIN 81 MG PO TBEC: 81.0000 mg | DELAYED_RELEASE_TABLET | Freq: Every day | ORAL | Status: AC

## 2024-01-19 NOTE — Telephone Encounter (Signed)
 Received Watchman referral from Lamarr Hummer, PA-C. Attempted to call patient to arrange OV. Left message for him to return call.

## 2024-01-19 NOTE — Progress Notes (Signed)
 [facility]   MULTIDISCIPLINARY HEART VALVE CLINIC                                     Cardiology Office Note:    Date:  01/19/2024   ID:  Zachary Andrews, DOB Oct 02, 1955, MRN [id number]  PCP:  Ofilia Lamar CROME, MD  [facility] Cardiologist:  Redell Leiter, MD  [facility] Structural heart: Lurena MARLA Red, MD [facility] Electrophysiologist:  None   Referring MD: Ofilia Lamar CROME, MD   [facility] s/p TAVR  History of Present Illness:    Zachary Andrews is a 68 y.o. male with a hx of CAD, persistent afib on Eliquis , DMT2, HTN, HLD, CKD II, morbid obesity, HFpEF and moderate AS s/p TAVR (01/06/24) through Progress CAP trial who presents to clinic for follow up.   Mr Kleinman developed exertional SOB and found to have moderate AS. He was referred to structural heart clinic and enrolled in the Progress CAP trial. Echo 10/23/23 showed EF 60%, mod LVH, moderate AS w/ mean grad 31.0 mmHg, Vmax 2.94 m/s, AVA 1.07 cm2, DVI 0.39, SVI 34. L/RHC 11/27/23 showed minimal, nonobstructive coronary artery disease and elevated filling pressures. Lasix  was increased at that time. S/p TAVR with 29 mm Edwards Sapien 3 Resilia THV via the TF approach on 01/06/24. Post op echo showed EF 55%, normally functioning TAVR with a mean gradient of 7 mmHg and no PVL. LVEDP was 26 mm hg and diuresed with IV Lasix . Noted to have hematuria.   Today the patient presents to clinic for follow up .  He is here with his wife.  He is doing quite well after TAVR with an improvement in his breathing and exercise tolerance.  He is currently off of Eliquis  and on a baby aspirin  due to recurrent hematuria associated with anemia.  He also reports worsening of his chronic lower extremity weakness when being on Eliquis .  His activity is mostly limited by leg and back pain treated with periodic injections that only provide temporary relief.  He has some mild dizziness but no syncope.  No chest pain.  No lower extremity edema,  orthopnea or PND   Past Medical History:  Diagnosis Date   Abdominal aortic atherosclerosis 05/19/2020   05/19/2020: on CT     Acute non-recurrent sinusitis 07/09/2023   07/09/2023     Acute renal insufficiency 09/16/2019   Aortic stenosis, moderate 09/09/2017   Formatting of this note might be different from the original.  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   Carbuncle 08/20/2023   08/20/2023: right axilla     Carotid stenosis, right 09/09/2017   Carpal tunnel syndrome, bilateral 06/01/2015   Central spinal stenosis 12/21/2016   Chronic bilateral low back pain with left-sided sciatica 07/11/2015   Coronary artery disease involving native coronary artery of native heart 12/21/2016   Deviated nasal septum 01/31/2022   01/31/2022, rightward     Elevated LFTs 08/22/2017   ICD-10 cut over   Frequent falls 10/15/2016   H/O spinal stenosis 08/22/2017   Hepatic steatosis 05/19/2020   05/19/2020: on CT     History of cigarette smoking 05/27/2022   Last 2014, 44 pack years     History of pulmonary embolism 09/21/2019   Hypertension 02/06/2015   Hypertensive heart disease 02/06/2015  Moderate LVH     Hypotension 09/16/2019   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   02/07/2022:     Ingrown nail 09/29/2021   Intervertebral disc disorder with radiculopathy of lumbosacral region 10/04/2014   Ischial bursitis 08/03/2019   Lumbar disc disease 08/22/2017   Lumbar radiculopathy 12/25/2015   Lumbar spondylosis 04/29/2016   Lumbar stenosis with neurogenic claudication 12/21/2016   Microalbuminuria 08/22/2017   Mixed hyperlipidemia 03/13/2015   Refusing statin   Nephrolithiasis 12/21/2016   Neuropathy 08/19/2017   From cervical and lumbar spinal stenosis  And carpal tunnel   Non-recurrent unilateral inguinal hernia without obstruction or gangrene 12/23/2016   2018   Numbness 02/06/2015   Obesity  08/22/2017   Obstructive sleep apnea on CPAP 04/10/2016   Managed PULM   Occlusion of right internal carotid artery 12/21/2016   >70%   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   Osteoarthritis of shoulder 12/21/2016   Pain around toenail, right foot 09/29/2021   Pain in left knee 07/08/2017   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 2 diabetes mellitus (HCC) 09/13/2021   Polyp of colon 12/21/2016   Primary osteoarthritis of left hip 11/19/2016   Pulmonary embolism (HCC) 09/21/2019   Rib pain on left side 05/19/2020   05/19/2020:     Risk for falls 04/16/2021   04/16/2021 : high risk, is falling, will not use walker; has poor core   strength and foot neuropathy; declined PT, etc; risk discussed     Sensorineural hearing loss (SNHL) of both ears 04/20/2018   Sexual dysfunction 08/22/2017   Spinal cord compression (HCC) 02/06/2015   Stage 2 chronic kidney disease 09/10/2017   Stage 3a chronic kidney disease (HCC) 10/23/2021   10/23/2021: 56, stable, HTN/DM/VASC     Surgical wound present 12/02/2022   12/02/2022 : LSS     Suspected severe acute respiratory syndrome coronavirus 2 (SARS-CoV-2) infection 09/16/2019   Symptoms involving cardiovascular system 01/12/2019   Tinea unguium 12/21/2016   Tinnitus of both ears 04/20/2018   Type 2 diabetes mellitus without complication, without long-term current use of insulin  (HCC) 04/10/2016   Vitamin D deficiency 04/10/2016     Current Medications: Current Meds  Medication Sig   amoxicillin  (AMOXIL ) 500 MG tablet Take 4 tablets (2,000 mg total) by mouth as directed. 1 hour prior to dental work including cleanings   Ascorbic Acid (VITAMIN C) 1000 MG tablet Take 2,000 mg by mouth daily.   aspirin  EC 81 MG tablet Take 1 tablet (81 mg total) by mouth daily. Swallow whole.   carboxymethylcellulose 1 % ophthalmic solution Place 1 drop into both eyes daily as needed (Dry eyes).   Dulaglutide (TRULICITY) 4.5 MG/0.5ML SOPN Inject 4.5 mg into the  skin every 7 (seven) days.   Evolocumab  (REPATHA  SURECLICK) 140 MG/ML SOAJ Inject 1 pen into the skin every 14 (fourteen) days.   fluticasone (FLONASE) 50 MCG/ACT nasal spray Place 1 spray into both nostrils daily as needed for rhinitis. (Patient taking differently: Place 1 spray into both nostrils daily as needed for rhinitis. Taking every other day)   furosemide  (LASIX ) 20 MG tablet Take 1 tablet (20 mg total) by mouth daily.   lisinopril (ZESTRIL) 20 MG tablet Take 20 mg by mouth daily.   nitroGLYCERIN  (NITROSTAT ) 0.4 MG SL tablet Place 1 tablet (0.4 mg total) under the tongue every 5 (five) minutes as needed for chest pain.   pregabalin (LYRICA) 50 MG capsule Take 50 mg by mouth as needed.   [DISCONTINUED] amoxicillin  (AMOXIL ) 500  MG capsule Take 4 capsules (2,000 mg total) by mouth daily as needed. Take two hours prior to your dental work.      ROS:   Please see the history of present illness.    All other systems reviewed and are negative.  EKGs   EKG Interpretation Date/Time:  Monday January 19 2024 10:35:35 EST Ventricular Rate:  81 PR Interval:    QRS Duration:  82 QT Interval:  344 QTC Calculation: 399 R Axis:   71  Text Interpretation: Atrial fibrillation When compared with ECG of 07-Jan-2024 02:55, No significant change was found Confirmed by Sebastian Collar [phone]) on 01/19/2024 10:46:49 AM   Risk Assessment/Calculations:           Physical Exam:    VS:  There were no vitals taken for this visit.    Wt Readings from Last 3 Encounters:  01/07/24 (!) 311 lb 4.6 oz (141.2 kg)  12/19/23 (!) 310 lb (140.6 kg)  12/02/23 (!) 307 lb 9.6 oz (139.5 kg)     GEN: Well nourished, well developed in no acute distress NECK: No JVD CARDIAC: irreg irreg, no murmurs, rubs, gallops RESPIRATORY:  Clear to auscultation without rales, wheezing or rhonchi  ABDOMEN: Soft, non-tender, non-distended EXTREMITIES:  No edema; No deformity.  Groin sites clear without hematoma or  ecchymosis.   ASSESSMENT:    1. S/P TAVR (transcatheter aortic valve replacement)   2. Chronic heart failure with preserved ejection fraction (HFpEF) (HCC)   3. Hematuria, unspecified type   4. Hypertension associated with diabetes (HCC)   5. Hyperlipidemia associated with type 2 diabetes mellitus (HCC)   6. PAF (paroxysmal atrial fibrillation) (HCC)   7. Morbid obesity, unspecified obesity type (HCC)     PLAN:    In order of problems listed above:  Moderate AS s/p TAVR through Progress CAP trial:  -- Pt doing great s/p TAVR with improvement in breathing and energy.  -- ECG with no HAVB.  -- Groin sites healing well.  -- SBE discussed. He has amoxicillin .  -- Continue aspirin  81mg  daily (Eliquis  5mg  BID on hold currently due to hematuria).  -- Cleared to resume all activities without restriction. -- I will see back for 1 month echo and OV.   Chronic HFpEF: -- Appears euvolemic.  -- Continue Lasix  20mg  daily.  Hematuria -- Has has ongoing issues with this and currently Eliquis  on hold.  -- He has follow up with urology. Dr. Debrah in [facility].   HTN: -- BP well controlled.  -- Continue lisinopril 20mg  daily.   HLD: -- Continue Repatha  140mg  q 2 weeks  Persistent afib: -- HR well controlled of AV Nodal blocking agents. -- He is currently off of Eliquis  and on a baby aspirin  due to recurrent hematuria associated with anemia.  He also reports worsening of his chronic lower extremity weakness when being on Eliquis . -- Left atrial appendage occluder device was discussed with him and he is very interested.  We will send a referral to EP for consideration of Watchman.  He is also interested in restoration of sinus rhythm which I think is reasonable given HFpEF with recent documentation of elevated LVEDP.  Morbid obesity: -- BMI 40. --Continue Trulicity 4.5mg  qweekly    Medication Adjustments/Labs and Tests Ordered: Current medicines are reviewed at length with the  patient today.  Concerns regarding medicines are outlined above.  Orders Placed This Encounter  Procedures   EKG 12-Lead   ECHOCARDIOGRAM COMPLETE   Meds ordered this encounter  Medications   amoxicillin  (AMOXIL ) 500 MG tablet    Sig: Take 4 tablets (2,000 mg total) by mouth as directed. 1 hour prior to dental work including cleanings    Dispense:  12 tablet    Refill:  12    Supervising Provider:   WONDA, MICHAEL [[id number]]   aspirin  EC 81 MG tablet    Sig: Take 1 tablet (81 mg total) by mouth daily. Swallow whole.    Patient Instructions  Medication Instructions:  Your physician has recommended you make the following change in your medication:  START Amoxicillin  500 mg, take 4 tablets by mouth 1 hour prior to dental procedures and cleanings.   CONTINUE aspirin  81mg  instead of Eliquis . Per Dr. Monetta.  *If you need a refill on your cardiac medications before your next appointment, please call your pharmacy*  Lab Work: None needed If you have labs (blood work) drawn today and your tests are completely normal, you will receive your results only by: MyChart Message (if you have MyChart) OR A paper copy in the mail If you have any lab test that is abnormal or we need to change your treatment, we will call you to review the results.  Testing/Procedures: 02/09/24 Your physician has requested that you have an echocardiogram. Echocardiography is a painless test that uses sound waves to create images of your heart. It provides your doctor with information about the size and shape of your heart and how well your heart's chambers and valves are working. This procedure takes approximately one hour. There are no restrictions for this procedure. Please do NOT wear cologne, perfume, aftershave, or lotions (deodorant is allowed). Please arrive 15 minutes prior to your appointment time.  Please note: We ask at that you not bring children with you during ultrasound (echo/ vascular) testing. Due to  room size and safety concerns, children are not allowed in the ultrasound rooms during exams. Our front office staff cannot provide observation of children in our lobby area while testing is being conducted. An adult accompanying a patient to their appointment will only be allowed in the ultrasound room at the discretion of the ultrasound technician under special circumstances. We apologize for any inconvenience.   Follow-Up: At [facility], you and your health needs are our priority.  As part of our continuing mission to provide you with exceptional heart care, our providers are all part of one team.  This team includes your primary Cardiologist (physician) and Advanced Practice Providers or APPs (Physician Assistants and Nurse Practitioners) who all work together to provide you with the care you need, when you need it.  Your next appointment:   As scheduled on 02/09/24  Provider:   Izetta Hummer, PA-C  We recommend signing up for the patient portal called MyChart.  Sign up information is provided on this After Visit Summary.  MyChart is used to connect with patients for Virtual Visits (Telemedicine).  Patients are able to view lab/test results, encounter notes, upcoming appointments, etc.  Non-urgent messages can be sent to your provider as well.   To learn more about what you can do with MyChart, go to [email or web address].   Other Instructions You have been referred to the EP Department for a possible Watchman procedure. The Watchman team will reach out to you promptly for further appointments or testing.    Signed, Lamarr Hummer, PA-C  01/19/2024 11:44 AM    [facility]

## 2024-01-19 NOTE — Telephone Encounter (Signed)
 Arranged Watchman consult 02/27/2024 with Dr. Almetta.

## 2024-01-19 NOTE — Patient Instructions (Signed)
 Medication Instructions:  Your physician has recommended you make the following change in your medication:  START Amoxicillin  500 mg, take 4 tablets by mouth 1 hour prior to dental procedures and cleanings.   CONTINUE aspirin  81mg  instead of Eliquis . Per Dr. Monetta.  *If you need a refill on your cardiac medications before your next appointment, please call your pharmacy*  Lab Work: None needed If you have labs (blood work) drawn today and your tests are completely normal, you will receive your results only by: MyChart Message (if you have MyChart) OR A paper copy in the mail If you have any lab test that is abnormal or we need to change your treatment, we will call you to review the results.  Testing/Procedures: 02/09/24 Your physician has requested that you have an echocardiogram. Echocardiography is a painless test that uses sound waves to create images of your heart. It provides your doctor with information about the size and shape of your heart and how well your heart's chambers and valves are working. This procedure takes approximately one hour. There are no restrictions for this procedure. Please do NOT wear cologne, perfume, aftershave, or lotions (deodorant is allowed). Please arrive 15 minutes prior to your appointment time.  Please note: We ask at that you not bring children with you during ultrasound (echo/ vascular) testing. Due to room size and safety concerns, children are not allowed in the ultrasound rooms during exams. Our front office staff cannot provide observation of children in our lobby area while testing is being conducted. An adult accompanying a patient to their appointment will only be allowed in the ultrasound room at the discretion of the ultrasound technician under special circumstances. We apologize for any inconvenience.   Follow-Up: At [facility], you and your health needs are our priority.  As part of our continuing mission to provide you with  exceptional heart care, our providers are all part of one team.  This team includes your primary Cardiologist (physician) and Advanced Practice Providers or APPs (Physician Assistants and Nurse Practitioners) who all work together to provide you with the care you need, when you need it.  Your next appointment:   As scheduled on 02/09/24  Provider:   Izetta Hummer, PA-C  We recommend signing up for the patient portal called MyChart.  Sign up information is provided on this After Visit Summary.  MyChart is used to connect with patients for Virtual Visits (Telemedicine).  Patients are able to view lab/test results, encounter notes, upcoming appointments, etc.  Non-urgent messages can be sent to your provider as well.   To learn more about what you can do with MyChart, go to [email or web address].   Other Instructions You have been referred to the EP Department for a possible Watchman procedure. The Watchman team will reach out to you promptly for further appointments or testing.

## 2024-01-20 ENCOUNTER — Telehealth: Payer: Self-pay

## 2024-01-20 NOTE — Telephone Encounter (Signed)
 Small appendage with elliptical ostium Max 21/ AVG 16.3/ Depth 13 Likely use a 20mm device Inf/Ant TSP RAO 11 CAU 7

## 2024-01-21 ENCOUNTER — Ambulatory Visit: Admitting: Internal Medicine

## 2024-02-09 ENCOUNTER — Encounter: Payer: Self-pay | Admitting: Cardiology

## 2024-02-09 ENCOUNTER — Encounter

## 2024-02-09 ENCOUNTER — Ambulatory Visit: Admission: RE | Admit: 2024-02-09 | Discharge: 2024-02-09 | Attending: Cardiology

## 2024-02-09 ENCOUNTER — Ambulatory Visit: Payer: Self-pay | Admitting: Physician Assistant

## 2024-02-09 ENCOUNTER — Ambulatory Visit: Admitting: Physician Assistant

## 2024-02-09 ENCOUNTER — Ambulatory Visit: Admitting: Cardiology

## 2024-02-09 VITALS — BP 108/64 | HR 121 | Ht 74.0 in | Wt 302.4 lb

## 2024-02-09 VITALS — BP 118/70 | HR 77 | Ht 74.0 in | Wt 301.6 lb

## 2024-02-09 DIAGNOSIS — I5032 Chronic diastolic (congestive) heart failure: Secondary | ICD-10-CM

## 2024-02-09 DIAGNOSIS — I4819 Other persistent atrial fibrillation: Secondary | ICD-10-CM | POA: Insufficient documentation

## 2024-02-09 DIAGNOSIS — E1169 Type 2 diabetes mellitus with other specified complication: Secondary | ICD-10-CM | POA: Diagnosis present

## 2024-02-09 DIAGNOSIS — E785 Hyperlipidemia, unspecified: Secondary | ICD-10-CM

## 2024-02-09 DIAGNOSIS — E1159 Type 2 diabetes mellitus with other circulatory complications: Secondary | ICD-10-CM

## 2024-02-09 DIAGNOSIS — Z952 Presence of prosthetic heart valve: Secondary | ICD-10-CM

## 2024-02-09 DIAGNOSIS — R319 Hematuria, unspecified: Secondary | ICD-10-CM | POA: Insufficient documentation

## 2024-02-09 DIAGNOSIS — Z006 Encounter for examination for normal comparison and control in clinical research program: Secondary | ICD-10-CM

## 2024-02-09 DIAGNOSIS — I152 Hypertension secondary to endocrine disorders: Secondary | ICD-10-CM | POA: Diagnosis present

## 2024-02-09 LAB — ECHOCARDIOGRAM COMPLETE
AR max vel: 4.22 cm2
AV Area VTI: 3.79 cm2
AV Area mean vel: 3.97 cm2
AV Mean grad: 6.3 mmHg
AV Peak grad: 11.2 mmHg
Ao pk vel: 1.67 m/s
Calc EF: 66.6 %
S' Lateral: 3.2 cm
Single Plane A2C EF: 58.4 %
Single Plane A4C EF: 69.7 %

## 2024-02-09 MED ORDER — NYSTATIN 100000 UNIT/GM EX OINT: 1.0000 | TOPICAL_OINTMENT | Freq: Two times a day (BID) | CUTANEOUS | 3 refills | Status: AC

## 2024-02-09 NOTE — Patient Instructions (Signed)
 Medication Instructions:  Your physician recommends that you continue on your current medications as directed. Please refer to the Current Medication list given to you today.  *If you need a refill on your cardiac medications before your next appointment, please call your pharmacy*  Lab Work: None If you have labs (blood work) drawn today and your tests are completely normal, you will receive your results only by: MyChart Message (if you have MyChart) OR A paper copy in the mail If you have any lab test that is abnormal or we need to change your treatment, we will call you to review the results.  Testing/Procedures: None  Follow-Up: At [facility], you and your health needs are our priority.  As part of our continuing mission to provide you with exceptional heart care, our providers are all part of one team.  This team includes your primary Cardiologist (physician) and Advanced Practice Providers or APPs (Physician Assistants and Nurse Practitioners) who all work together to provide you with the care you need, when you need it.  Your next appointment:   3 month(s)  Provider:   Norman Herrlich, MD    We recommend signing up for the patient portal called "MyChart".  Sign up information is provided on this After Visit Summary.  MyChart is used to connect with patients for Virtual Visits (Telemedicine).  Patients are able to view lab/test results, encounter notes, upcoming appointments, etc.  Non-urgent messages can be sent to your provider as well.   To learn more about what you can do with MyChart, go to [email or web address].   Other Instructions None

## 2024-02-09 NOTE — Progress Notes (Signed)
 Cardiology Office Note:    Date:  02/09/2024   ID:  Zachary Andrews, DOB [birth year], MRN [id number]  PCP:  Ofilia Lamar CROME, MD  Cardiologist:  Redell Leiter, MD    Referring MD: Ofilia Lamar CROME, MD    ASSESSMENT:    1. S/P TAVR (transcatheter aortic valve replacement)   2. Chronic heart failure with preserved ejection fraction (HFpEF) (HCC)   3. Persistent atrial fibrillation (HCC)   4. Hematuria, unspecified type   5. Hyperlipidemia associated with type 2 diabetes mellitus (HCC)   6. Morbid obesity, unspecified obesity type (HCC)    PLAN:    In order of problems listed above:  He has had successful TAVR good result by echo postprocedure and clinically has improved with no signs of heart failure and asymptomatic Unfortunately has persistent atrial fibrillation and presently cannot be anticoagulated because of renal calculus and gross hematuria.  He is going to be seeing EP and we could consider after the Watchman device or coincident A-fib ablation. Next step is to see urology and have his renal calculus taken care of afterwards if he can be anticoagulated before and after we can consider Watchman device I told him to keep his appointment with Dr. Almetta Continue his current lipid-lowering therapy Repatha  he is statin intolerant Continue aspirin  as antithrombotic therapy   Next appointment: 6 months   Medication Adjustments/Labs and Tests Ordered: Current medicines are reviewed at length with the patient today.  Concerns regarding medicines are outlined above.  Orders Placed This Encounter  Procedures   EKG 12-Lead   No orders of the defined types were placed in this encounter.    History of Present Illness:    Zachary Andrews is a 68 y.o. male with a hx of CAD persistent atrial fibrillation on Eliquis  type 2 diabetes hypertension hyperlipidemia stage II CKD morbid obesity heart failure preserved ejection fraction and aortic stenosis last seen 01/19/2024 by structural  heart follow-up after TAVR 01/06/2024.  He has had recurrent hematuria and is off his anticoagulant.  There is a notation that he is being considered for Watchman device.  An echocardiogram done today showing good result from TAVR and ECG done today by structural heart showing rapid atrial fibrillation right axis deviation.  Compliance with diet, lifestyle and medications: Yes  Fortunately did well with TAVR noes longer is having cardiovascular symptoms edema shortness of breath chest pain palpitation or syncope Unfortunately has a history of renal calculi has a residual calculus in his kidney some discussion of surgery with his urologist and is intolerant of anticoagulant with recurrent gross hematuria Presently he is taking low-dose aspirin  He was advised a Watchman procedure wanted to speak to me His heart rate is rapid today but his wife checks blood pressure and heart rate at home and typically runs in the range of 70 to 80 bpm Past Medical History:  Diagnosis Date   Abdominal aortic atherosclerosis 05/19/2020   05/19/2020: on CT     Acute non-recurrent sinusitis 07/09/2023   07/09/2023     Acute renal insufficiency 09/16/2019   Aortic stenosis, moderate 09/09/2017   Formatting of this note might be different from the original.  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   Carbuncle 08/20/2023   08/20/2023: right axilla     Carotid stenosis, right 09/09/2017   Carpal tunnel syndrome, bilateral 06/01/2015   Central spinal stenosis 12/21/2016   Chronic  bilateral low back pain with left-sided sciatica 07/11/2015   Coronary artery disease involving native coronary artery of native heart 12/21/2016   Deviated nasal septum 01/31/2022   01/31/2022, rightward     Elevated LFTs 08/22/2017   ICD-10 cut over   Frequent falls 10/15/2016   H/O spinal stenosis 08/22/2017   Hepatic steatosis 05/19/2020    05/19/2020: on CT     History of cigarette smoking 05/27/2022   Last 2014, 44 pack years     History of pulmonary embolism 09/21/2019   Hypertension 02/06/2015   Hypertensive heart disease 02/06/2015   Moderate LVH     Hypotension 09/16/2019   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   02/07/2022:     Ingrown nail 09/29/2021   Intervertebral disc disorder with radiculopathy of lumbosacral region 10/04/2014   Ischial bursitis 08/03/2019   Lumbar disc disease 08/22/2017   Lumbar radiculopathy 12/25/2015   Lumbar spondylosis 04/29/2016   Lumbar stenosis with neurogenic claudication 12/21/2016   Microalbuminuria 08/22/2017   Mixed hyperlipidemia 03/13/2015   Refusing statin   Nephrolithiasis 12/21/2016   Neuropathy 08/19/2017   From cervical and lumbar spinal stenosis  And carpal tunnel   Non-recurrent unilateral inguinal hernia without obstruction or gangrene 12/23/2016   2018   Numbness 02/06/2015   Obesity 08/22/2017   Obstructive sleep apnea on CPAP 04/10/2016   Managed PULM   Occlusion of right internal carotid artery 12/21/2016   >70%   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   Osteoarthritis of shoulder 12/21/2016   Pain around toenail, right foot 09/29/2021   Pain in left knee 07/08/2017   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 2 diabetes mellitus (HCC) 09/13/2021   Polyp of colon 12/21/2016   Primary osteoarthritis of left hip 11/19/2016   Pulmonary embolism (HCC) 09/21/2019   Rib pain on left side 05/19/2020   05/19/2020:     Risk for falls 04/16/2021   04/16/2021 : high risk, is falling, will not use walker; has poor core   strength and foot neuropathy; declined PT, etc; risk discussed     Sensorineural hearing loss (SNHL) of both ears 04/20/2018   Sexual dysfunction 08/22/2017   Spinal cord compression (HCC) 02/06/2015   Stage 2 chronic kidney disease 09/10/2017   Stage 3a chronic kidney disease (HCC) 10/23/2021   10/23/2021: 56, stable, HTN/DM/VASC     Surgical  wound present 12/02/2022   12/02/2022 : LSS     Suspected severe acute respiratory syndrome coronavirus 2 (SARS-CoV-2) infection 09/16/2019   Symptoms involving cardiovascular system 01/12/2019   Tinea unguium 12/21/2016   Tinnitus of both ears 04/20/2018   Type 2 diabetes mellitus without complication, without long-term current use of insulin  (HCC) 04/10/2016   Vitamin D deficiency 04/10/2016    Current Medications: Active Medications[1]    EKGs/Labs/Other Studies Reviewed:    The following studies were reviewed today:  Cardiac Studies & Procedures   ______________________________________________________________________________________________ CARDIAC CATHETERIZATION  CARDIAC CATHETERIZATION 11/27/2023  Conclusion   1st Diag lesion is 20% stenosed.  1.  Minimal, nonobstructive coronary artery disease. 2.  Fick cardiac output of 9.9 L/min and Fick cardiac index of 3.8 L/min/m with the following hemodynamics: Right atrial pressure mean of 15 mmHg Right ventricular pressure 37/8 with end-diastolic pressure of 16 mmHg Wedge pressure mean of 23 mmHg V waves to 28 mmHg PA pressure of 32/26 with a mean of 29 mmHg PVR of 0.6 Woods units PA pulsatility necks of 0.4 3.  Capacious iliofemoral vessels bilaterally.  Recommendation: Given elevated filling pressures  and low PA pulsatility index will intensify Lasix  to 20 daily and check BMP in 1 week.  Will arrange outpatient referral to discuss management of aortic stenosis.  Findings Coronary Findings Diagnostic  Dominance: Right  Left Anterior Descending The vessel exhibits minimal luminal irregularities.  First Diagonal Branch 1st Diag lesion is 20% stenosed.  Left Circumflex The vessel exhibits minimal luminal irregularities.  Right Coronary Artery The vessel exhibits minimal luminal irregularities.  Intervention  No interventions have been documented.   STRESS TESTS  MYOCARDIAL PERFUSION IMAGING  10/13/2019  Interpretation Summary  The left ventricular ejection fraction is normal (55-65%).  Nuclear stress EF: 63%.  There was no ST segment deviation noted during stress.  This is a low risk study.   ECHOCARDIOGRAM  ECHOCARDIOGRAM COMPLETE 02/09/2024  Narrative ECHOCARDIOGRAM REPORT    Patient Name:   Zachary Andrews Date of Exam: 02/09/2024 Medical Rec #:  [id number]      Height:       74.0 in Accession #:    [id number]     Weight:       308.1 lb Date of Birth:  1955/10/22     BSA:          2.612 m Patient Age:    68 years       BP:           159/90 mmHg Patient Gender: M              HR:           62 bpm. Exam Location:  [address]  Procedure: 2D Echo, Cardiac Doppler and Color Doppler (Both Spectral and Color Flow Doppler were utilized during procedure).  Indications:    Aortic Stenosis I35.0; s/p TAVR Z95.2  History:        Patient has prior history of Echocardiogram examinations, most recent 01/07/2024. CKD; Risk Factors:Hypertension, Dyslipidemia and Diabetes. Aortic Valve: 29 mm Edwards Sapien prosthetic, stented (TAVR) valve is present in the aortic position. Procedure Date: 01/06/2024.  Sonographer:    Augustin Seals RDCS Referring Phys: [id number] KATHRYN R THOMPSON  IMPRESSIONS   1. Left ventricular ejection fraction, by estimation, is 65 to 70%. The left ventricle has normal function. The left ventricle has no regional wall motion abnormalities. There is mild concentric left ventricular hypertrophy. Left ventricular diastolic function could not be evaluated. 2. Right ventricular systolic function is normal. The right ventricular size is normal. There is normal pulmonary artery systolic pressure. 3. The mitral valve is normal in structure. No evidence of mitral valve regurgitation. No evidence of mitral stenosis. 4. The aortic valve has been repaired/replaced. Aortic valve regurgitation is not visualized. No aortic stenosis is present. There is a 29  mm Edwards Sapien prosthetic (TAVR) valve present in the aortic position. Procedure Date: 01/06/2024. Echo findings are consistent with normal structure and function of the aortic valve prosthesis. Aortic valve area, by VTI measures 3.79 cm. Aortic valve mean gradient measures 6.3 mmHg. Aortic valve Vmax measures 1.67 m/s. 5. Aortic dilatation noted. There is mild dilatation of the ascending aorta, measuring 41 mm. 6. The inferior vena cava is normal in size with greater than 50% respiratory variability, suggesting right atrial pressure of 3 mmHg.  FINDINGS Left Ventricle: Left ventricular ejection fraction, by estimation, is 65 to 70%. The left ventricle has normal function. The left ventricle has no regional wall motion abnormalities. The left ventricular internal cavity size was normal in size. There is mild concentric left ventricular hypertrophy. Left ventricular  diastolic function could not be evaluated due to atrial fibrillation. Left ventricular diastolic function could not be evaluated.  Right Ventricle: The right ventricular size is normal. No increase in right ventricular wall thickness. Right ventricular systolic function is normal. There is normal pulmonary artery systolic pressure. The tricuspid regurgitant velocity is 1.89 m/s, and with an assumed right atrial pressure of 3 mmHg, the estimated right ventricular systolic pressure is 17.3 mmHg.  Left Atrium: Left atrial size was normal in size.  Right Atrium: Right atrial size was normal in size.  Pericardium: There is no evidence of pericardial effusion.  Mitral Valve: The mitral valve is normal in structure. No evidence of mitral valve regurgitation. No evidence of mitral valve stenosis.  Tricuspid Valve: The tricuspid valve is normal in structure. Tricuspid valve regurgitation is trivial. No evidence of tricuspid stenosis.  Aortic Valve: The aortic valve has been repaired/replaced. Aortic valve regurgitation is not visualized.  No aortic stenosis is present. Aortic valve mean gradient measures 6.3 mmHg. Aortic valve peak gradient measures 11.2 mmHg. Aortic valve area, by VTI measures 3.79 cm. There is a 29 mm Edwards Sapien prosthetic, stented (TAVR) valve present in the aortic position. Procedure Date: 01/06/2024. Echo findings are consistent with normal structure and function of the aortic valve prosthesis.  Pulmonic Valve: The pulmonic valve was normal in structure. Pulmonic valve regurgitation is not visualized. No evidence of pulmonic stenosis.  Aorta: Aortic dilatation noted. There is mild dilatation of the ascending aorta, measuring 41 mm.  Venous: The inferior vena cava is normal in size with greater than 50% respiratory variability, suggesting right atrial pressure of 3 mmHg.  IAS/Shunts: No atrial level shunt detected by color flow Doppler.   LEFT VENTRICLE PLAX 2D LVIDd:         4.90 cm     Diastology LVIDs:         3.20 cm     LV e' medial: 7.51 cm/s LV PW:         1.30 cm LV IVS:        1.30 cm LVOT diam:     2.40 cm LV SV:         116 LV SV Index:   44 LVOT Area:     4.52 cm  LV Volumes (MOD) LV vol d, MOD A2C: 89.9 ml LV vol d, MOD A4C: 97.8 ml LV vol s, MOD A2C: 37.4 ml LV vol s, MOD A4C: 29.6 ml LV SV MOD A2C:     52.5 ml LV SV MOD A4C:     97.8 ml LV SV MOD BP:      65.7 ml  RIGHT VENTRICLE             IVC RV Basal diam:  3.80 cm     IVC diam: 1.80 cm RV Mid diam:    3.10 cm RV S prime:     12.20 cm/s  PULMONARY VEINS TAPSE (M-mode): 2.0 cm      Systolic Velocity: 54.40 cm/s  LEFT ATRIUM             Index        RIGHT ATRIUM           Index LA diam:        4.90 cm 1.88 cm/m   RA Area:     20.90 cm LA Vol (A2C):   41.4 ml 15.85 ml/m  RA Volume:   55.60 ml  21.29 ml/m LA Vol (A4C):   46.2 ml  17.69 ml/m LA Biplane Vol: 45.8 ml 17.54 ml/m AORTIC VALVE AV Area (Vmax):    4.22 cm AV Area (Vmean):   3.97 cm AV Area (VTI):     3.79 cm AV Vmax:           167.33 cm/s AV  Vmean:          114.667 cm/s AV VTI:            0.305 m AV Peak Grad:      11.2 mmHg AV Mean Grad:      6.3 mmHg LVOT Vmax:         156.00 cm/s LVOT Vmean:        100.700 cm/s LVOT VTI:          0.256 m LVOT/AV VTI ratio: 0.84  AORTA Ao Root diam: 3.10 cm Ao Asc diam:  4.20 cm  TRICUSPID VALVE TR Peak grad:   14.3 mmHg TR Vmax:        189.00 cm/s  SHUNTS Systemic VTI:  0.26 m Systemic Diam: 2.40 cm  Annabella Scarce MD Electronically signed by Annabella Scarce MD Signature Date/Time: 02/09/2024/2:54:25 PM    Final    MONITORS  LONG TERM MONITOR (3-14 DAYS) 12/03/2023  Narrative Patch Wear Time:  4 days and 7 hours (2025-09-19T09:34:51-0400 to 2025-09-29T07:47:20-0400)  There were 2 diary events both atrial fibrillation.  Heart rate response is optimal on both recordings  There were no pauses 3 seconds or greater  Monitor 1 Atrial Fibrillation occurred continuously (100% burden), ranging from 45-182 bpm (avg of 81 bpm). Isolated VEs were rare (<1.0%), VE Couplets were rare (<1.0%), and no VE Triplets were present.  Monitor 2 Atrial Fibrillation occurred continuously (100% burden), ranging from 48-175 bpm (avg of 81 bpm). Isolated VEs were rare (<1.0%), VE Couplets were rare (<1.0%), and no VE Triplets were present.   CT SCANS  CT CORONARY MORPH W/CTA COR W/SCORE 12/10/2023  Addendum 12/18/2023  8:50 PM ADDENDUM REPORT: 12/18/2023 20:48  EXAM: OVER-READ INTERPRETATION  CT CHEST  The following report is an over-read performed by radiologist Dr. Oneil Devonshire of [facility] on 12/18/2023. This over-read does not include interpretation of cardiac or coronary anatomy or pathology. The coronary calcium score/coronary CTA interpretation by the cardiologist is attached.  COMPARISON:  CT of the chest from earlier in the same day.  FINDINGS: Cardiovascular: Mild atherosclerotic calcifications of the aorta are noted. Mild aneurysmal dilatation of the  ascending aorta to 4.1 cm is noted. No dissection is seen. No evidence of pulmonary embolism is noted.  Mediastinum/Nodes: There are no enlarged lymph nodes within the visualized mediastinum.  Lungs/Pleura: There is no pleural effusion. The visualized lungs appear clear.  Upper abdomen: No significant findings in the visualized upper abdomen.  Musculoskeletal/Chest wall: Mild degenerative changes of the thoracic spine are seen.  IMPRESSION: Dilatation of the ascending aorta to 4.1 cm. Recommend annual imaging followup by CTA or MRA. This recommendation follows 2010 ACCF/AHA/AATS/ACR/ASA/SCA/SCAI/SIR/STS/SVM Guidelines for the Diagnosis and Management of Patients with Thoracic Aortic Disease. Circulation. 2010; 121: Z733-z630. Aortic aneurysm NOS (ICD10-I71.9)   Electronically Signed By: Oneil Devonshire M.D. On: 12/18/2023 20:48  Narrative CLINICAL DATA:  Aortic Stenosis.  TAVR planning.  EXAM: Cardiac TAVR CT  TECHNIQUE: A non-contrast, gated CT scan was obtained with axial slices of 2.5 mm through the heart for aortic valve scoring. A 100 kV retrospective, gated, contrast cardiac scan was obtained. Gantry rotation speed was 230 msec and collimation was 0.63 mm. Nitroglycerin   was not given. The 3D dataset was reconstructed in systole with motion correction. The 3D data set was reconstructed in 5% intervals of the 0-95% of the R-R cycle. Systolic and diastolic phases were analyzed on a dedicated workstation using MPR, MIP, and VRT modes. The patient received 100 cc of contrast.  FINDINGS: Image quality: Excellent.  Noise artifact is: Limited.  Valve Morphology: Tricuspid aortic valve with severe calcifications and restricted leaflet motion in systole.  Aortic Valve Calcium score: 2212  Aortic annular dimension:  Phase assessed: 30%  Annular area: 571 mm2  Annular perimeter: 85.9 mm  Max diameter: 29.8 mm  Min diameter: 25.2 mm  Annular and subannular  calcification: None.  Membranous septum length: 9.0 mm  Optimal coplanar projection: LAO 10 CRA 3  Coronary Artery Height above Annulus:  Left Main: 14.1 mm  Right Coronary: 16.9 mm  Sinus of Valsalva Measurements:  Non-coronary: 35 mm  Right-coronary: 33 mm  Left-coronary: 35 mm  Sinus of Valsalva Height:  Non-coronary: 23.0 mm  Right-coronary: 21.9 mm  Left-coronary: 19.7 mm  Sinotubular Junction: 30 mm  Ascending Thoracic Aorta: 40 mm  Coronary Arteries: Normal coronary origin. Right dominance. The study was performed without use of NTG and is insufficient for plaque evaluation. Please refer to recent cardiac catheterization for coronary assessment.  Cardiac Morphology:  Right Atrium: Right atrial size is within normal limits.  Right Ventricle: The right ventricular cavity is within normal limits.  Left Atrium: Left atrial size is normal in size with no left atrial appendage filling defect.  Left Ventricle: The ventricular cavity size is within normal limits.  Pulmonary arteries: Dilated pulmonary artery suggestive of pulmonary hypertension.  Pulmonary veins: Normal pulmonary venous drainage.  Pericardium: Normal thickness with no significant effusion or calcium present.  Mitral Valve: The mitral valve is normal structure without significant calcification.  Extra-cardiac findings: See attached radiology report for non-cardiac structures.  IMPRESSION: 1. Tricuspid aortic valve with severe calcifications.  2. Annular measurements support a 29 mm S3 or 34 mm Evolut Pro.  3. No significant annular or subannular calcifications.  4. Sufficient coronary to annulus distance.  5. Optimal Fluoroscopic Angle for Delivery: LAO 10 CRA 3  6. Dilated pulmonary artery suggestive of pulmonary hypertension.  7. Mildly dilated ascending aorta up to 40 mm.  Darryle T. Barbaraann, MD  Electronically Signed: By: Darryle Barbaraann M.D. On: 12/10/2023  20:43   CT SCANS  CT CARDIAC SCORING (SELF PAY ONLY) 11/07/2023  Addendum 11/16/2023  4:18 PM ADDENDUM REPORT: 11/16/2023 16:15  EXAM: OVER-READ INTERPRETATION  CT CHEST  The following report is an over-read performed by radiologist Dr. Ree Levy [facility] on 11/16/2023. This over-read does not include interpretation of cardiac or coronary anatomy or pathology. The coronary calcium score interpretation by the cardiologist is attached.  COMPARISON:  None.  FINDINGS: Mediastinum/Nodes: No solid / cystic mediastinal masses. The visualized esophagus is nondistended precluding optimal assessment. No thoracic lymphadenopathy by size criteria.  Lungs/Pleura: Imaged tracheo-bronchial tree is patent. There are dependent changes in bilateral lungs. No mass or consolidation. No pleural effusion or pneumothorax. No suspicious lung nodules.  Upper Abdomen: Visualized upper abdominal viscera within normal limits.  Musculoskeletal: Bilateral mild symmetric gynecomastia noted. The visualized soft tissues of the chest wall are grossly unremarkable. No suspicious osseous lesions.  IMPRESSION: No acute or significant extracardiac findings.   Electronically Signed By: Ree Molt M.D. On: 11/16/2023 16:15  Narrative : CLINICAL DATA:  Cardiovascular Disease Risk stratification  EXAM:  Coronary Calcium Score  TECHNIQUE:  A gated, non-contrast computed tomography scan of the heart was  performed using 3mm slice thickness. Axial images were analyzed on a  dedicated workstation. Calcium scoring of the coronary arteries was  performed using the Agatston method.  FINDINGS:  Coronary Calcium Score:  Left main: 0  Left anterior descending artery: 72.2  Left circumflex artery: 0  Right coronary artery: 4.07  Total: 76.2  Pericardium: Normal.  Ascending Aorta: Normal caliber.  Pulmonary artery: Normal caliber  Non-cardiac: See separate report  from [facility].  IMPRESSION:  Coronary calcium score of 76.2. This was 46 percentile for age-, race-,  and sex-matched controls.  Aortic valve calcium score: 2725  RECOMMENDATIONS:  Coronary artery calcium (CAC) score is a strong predictor of  incident coronary heart disease (CHD) and provides predictive  information beyond traditional risk factors. CAC scoring is  reasonable to use in the decision to withhold, postpone, or initiate  statin therapy in intermediate-risk or selected borderline-risk  asymptomatic adults (age 7-75 years and LDL-C >=70 to <190 mg/dL)  who do not have diabetes or established atherosclerotic  cardiovascular disease (ASCVD).* In intermediate-risk (10-year ASCVD  risk >=7.5% to <20%) adults or selected borderline-risk (10-year  ASCVD risk >=5% to <7.5%) adults in whom a CAC score is measured for  the purpose of making a treatment decision the following  recommendations have been made:  If CAC=0, it is reasonable to withhold statin therapy and reassess  in 5 to 10 years, as long as higher risk conditions are absent  (diabetes mellitus, family history of premature CHD in first degree  relatives (males <55 years; females <65 years), cigarette smoking,  or LDL >=190 mg/dL).  If CAC is 1 to 99, it is reasonable to initiate statin therapy for  patients >=27 years of age.  If CAC is >=100 or >=75th percentile, it is reasonable to initiate  statin therapy at any age.  Cardiology referral should be considered for patients with CAC  scores >=400 or >=75th percentile.  *2018 AHA/ACC/AACVPR/AAPA/ABC/ACPM/ADA/AGS/APhA/ASPC/NLA/PCNA  Guideline on the Management of Blood Cholesterol: A Report of the  American College of Cardiology/American Heart Association Task Force  on Clinical Practice Guidelines. J Am Coll Cardiol.  2019;73(24):3168-3209.  Electronically Signed: By: Lamar Fitch M.D. On: 11/07/2023 17:50      ______________________________________________________________________________________________          Recent Labs: 12/02/2023: NT-Pro BNP 530 01/02/2024: ALT 30 01/07/2024: BUN 22; Creatinine, Ser 1.38; Hemoglobin 14.3; Magnesium  1.8; Platelets 139; Potassium 4.0; Sodium 138  Recent Lipid Panel    Component Value Date/Time   CHOL 129 07/04/2021 1525   TRIG 123 07/04/2021 1525   HDL 33 (L) 07/04/2021 1525   CHOLHDL 3.9 07/04/2021 1525   LDLCALC 74 07/04/2021 1525        Physical Exam:    VS:  BP 108/64   Pulse (!) 121   Ht 6' 2 (1.88 m)   Wt (!) 302 lb 6.4 oz (137.2 kg)   SpO2 96%   BMI 38.83 kg/m     Wt Readings from Last 3 Encounters:  02/09/24 (!) 302 lb 6.4 oz (137.2 kg)  02/09/24 (!) 301 lb 9.6 oz (136.8 kg)  01/19/24 (!) 308 lb 1.6 oz (139.8 kg)     GEN:  Well nourished, well developed in no acute distress HEENT: Normal NECK: No JVD; No carotid bruits LYMPHATICS: No lymphadenopathy CARDIAC: Irregular rate and rhythm variable first heart sound  no murmurs, rubs, gallops RESPIRATORY:  Clear to auscultation without rales, wheezing or rhonchi  ABDOMEN: Soft, non-tender, non-distended MUSCULOSKELETAL:  No edema; No deformity  SKIN: Warm and dry NEUROLOGIC:  Alert and oriented x 3 PSYCHIATRIC:  Normal affect    Signed, Redell Leiter, MD  02/09/2024 4:54 PM    [facility]      [1]  Current Meds  Medication Sig   amoxicillin  (AMOXIL ) 500 MG tablet Take 4 tablets (2,000 mg total) by mouth as directed. 1 hour prior to dental work including cleanings   Ascorbic Acid (VITAMIN C) 1000 MG tablet Take 2,000 mg by mouth daily. (Patient taking differently: Take 2,000 mg by mouth daily. Taking sometimes)   aspirin  EC 81 MG tablet Take 1 tablet (81 mg total) by mouth daily. Swallow whole.   carboxymethylcellulose 1 % ophthalmic solution Place 1 drop into both eyes daily as needed (Dry eyes).   Dulaglutide (TRULICITY) 4.5 MG/0.5ML SOPN  Inject 4.5 mg into the skin every 7 (seven) days.   Evolocumab  (REPATHA  SURECLICK) 140 MG/ML SOAJ Inject 1 pen into the skin every 14 (fourteen) days.   fluticasone (FLONASE) 50 MCG/ACT nasal spray Place 1 spray into both nostrils daily as needed for rhinitis. (Patient taking differently: Place 1 spray into both nostrils daily as needed for rhinitis. Taking every other day)   furosemide  (LASIX ) 20 MG tablet Take 1 tablet (20 mg total) by mouth daily.   lisinopril (ZESTRIL) 20 MG tablet Take 20 mg by mouth daily.   nitroGLYCERIN  (NITROSTAT ) 0.4 MG SL tablet Place 1 tablet (0.4 mg total) under the tongue every 5 (five) minutes as needed for chest pain.   NON FORMULARY Pt uses a cpap nightly   nystatin  ointment (MYCOSTATIN ) Apply 1 Application topically 2 (two) times daily.   pregabalin (LYRICA) 50 MG capsule Take 50 mg by mouth as needed.

## 2024-02-09 NOTE — Progress Notes (Signed)
 [facility]   MULTIDISCIPLINARY HEART VALVE CLINIC                                     Cardiology Office Note:    Date:  02/09/2024   ID:  Zachary Andrews, DOB December 21, 1955, MRN [id number]  PCP:  Ofilia Lamar CROME, MD  [facility] Cardiologist:  Redell Leiter, MD  [facility] Structural heart: Lurena MARLA Red, MD [facility] Electrophysiologist:  None   Referring MD: Ofilia Lamar CROME, MD   1 month s/p TAVR  History of Present Illness:    Zachary Andrews is a 68 y.o. male with a hx of CAD, persistent afib on Eliquis , DMT2, HTN, HLD, CKD II, morbid obesity, HFpEF and moderate AS s/p TAVR (01/06/24) through Progress CAP trial who presents to clinic for follow up.   Zachary Andrews developed exertional SOB and found to have moderate AS. He was referred to structural heart clinic and enrolled in the Progress CAP trial. Echo 10/23/23 showed EF 60%, mod LVH, moderate AS w/ mean grad 31.0 mmHg, Vmax 2.94 m/s, AVA 1.07 cm2, DVI 0.39, SVI 34. L/RHC 11/27/23 showed minimal, nonobstructive coronary artery disease and elevated filling pressures. Lasix  was increased at that time. S/p TAVR with 29 mm Edwards Sapien 3 Resilia THV via the TF approach on 01/06/24. Post op echo showed EF 55%, normally functioning TAVR with a mean gradient of 7 mmHg and no PVL. LVEDP was 26 mm hg and diuresed with IV Lasix . Noted to have hematuria.   Today the patient presents to clinic for follow up. He is here with his wife. He has had a big improvement since TAVR with breathing and exercise tolerance. He continued off of Eliquis  and on a baby aspirin  due to recurrent hematuria associated with anemia. Still hasn't been called by urology in [facility]. He also reports worsening of his chronic lower extremity weakness when being on Eliquis .  His activity is mostly limited by leg and back pain treated with periodic injections that only provide temporary relief. He may require back surgery at some point. No dizziness or  syncope.  No chest pain. No lower extremity edema, orthopnea or PND   Past Medical History:  Diagnosis Date   Abdominal aortic atherosclerosis 05/19/2020   05/19/2020: on CT     Acute non-recurrent sinusitis 07/09/2023   07/09/2023     Acute renal insufficiency 09/16/2019   Aortic stenosis, moderate 09/09/2017   Formatting of this note might be different from the original.  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   Carbuncle 08/20/2023   08/20/2023: right axilla     Carotid stenosis, right 09/09/2017   Carpal tunnel syndrome, bilateral 06/01/2015   Central spinal stenosis 12/21/2016   Chronic bilateral low back pain with left-sided sciatica 07/11/2015   Coronary artery disease involving native coronary artery of native heart 12/21/2016   Deviated nasal septum 01/31/2022   01/31/2022, rightward     Elevated LFTs 08/22/2017   ICD-10 cut over   Frequent falls 10/15/2016   H/O spinal stenosis 08/22/2017   Hepatic steatosis 05/19/2020   05/19/2020: on CT     History of cigarette smoking 05/27/2022   Last 2014, 44 pack years     History of pulmonary embolism 09/21/2019   Hypertension 02/06/2015   Hypertensive heart  disease 02/06/2015   Moderate LVH     Hypotension 09/16/2019   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   02/07/2022:     Ingrown nail 09/29/2021   Intervertebral disc disorder with radiculopathy of lumbosacral region 10/04/2014   Ischial bursitis 08/03/2019   Lumbar disc disease 08/22/2017   Lumbar radiculopathy 12/25/2015   Lumbar spondylosis 04/29/2016   Lumbar stenosis with neurogenic claudication 12/21/2016   Microalbuminuria 08/22/2017   Mixed hyperlipidemia 03/13/2015   Refusing statin   Nephrolithiasis 12/21/2016   Neuropathy 08/19/2017   From cervical and lumbar spinal stenosis  And carpal tunnel   Non-recurrent unilateral inguinal hernia without obstruction or gangrene 12/23/2016    2018   Numbness 02/06/2015   Obesity 08/22/2017   Obstructive sleep apnea on CPAP 04/10/2016   Managed PULM   Occlusion of right internal carotid artery 12/21/2016   >70%   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   Osteoarthritis of shoulder 12/21/2016   Pain around toenail, right foot 09/29/2021   Pain in left knee 07/08/2017   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 2 diabetes mellitus (HCC) 09/13/2021   Polyp of colon 12/21/2016   Primary osteoarthritis of left hip 11/19/2016   Pulmonary embolism (HCC) 09/21/2019   Rib pain on left side 05/19/2020   05/19/2020:     Risk for falls 04/16/2021   04/16/2021 : high risk, is falling, will not use walker; has poor core   strength and foot neuropathy; declined PT, etc; risk discussed     Sensorineural hearing loss (SNHL) of both ears 04/20/2018   Sexual dysfunction 08/22/2017   Spinal cord compression (HCC) 02/06/2015   Stage 2 chronic kidney disease 09/10/2017   Stage 3a chronic kidney disease (HCC) 10/23/2021   10/23/2021: 56, stable, HTN/DM/VASC     Surgical wound present 12/02/2022   12/02/2022 : LSS     Suspected severe acute respiratory syndrome coronavirus 2 (SARS-CoV-2) infection 09/16/2019   Symptoms involving cardiovascular system 01/12/2019   Tinea unguium 12/21/2016   Tinnitus of both ears 04/20/2018   Type 2 diabetes mellitus without complication, without long-term current use of insulin  (HCC) 04/10/2016   Vitamin D deficiency 04/10/2016     Current Medications: Current Meds  Medication Sig   amoxicillin  (AMOXIL ) 500 MG tablet Take 4 tablets (2,000 mg total) by mouth as directed. 1 hour prior to dental work including cleanings   Ascorbic Acid (VITAMIN C) 1000 MG tablet Take 2,000 mg by mouth daily. (Patient taking differently: Take 2,000 mg by mouth daily. Taking sometimes)   aspirin  EC 81 MG tablet Take 1 tablet (81 mg total) by mouth daily. Swallow whole.   carboxymethylcellulose 1 % ophthalmic solution Place  1 drop into both eyes daily as needed (Dry eyes).   Dulaglutide (TRULICITY) 4.5 MG/0.5ML SOPN Inject 4.5 mg into the skin every 7 (seven) days.   Evolocumab  (REPATHA  SURECLICK) 140 MG/ML SOAJ Inject 1 pen into the skin every 14 (fourteen) days.   fluticasone (FLONASE) 50 MCG/ACT nasal spray Place 1 spray into both nostrils daily as needed for rhinitis. (Patient taking differently: Place 1 spray into both nostrils daily as needed for rhinitis. Taking every other day)   furosemide  (LASIX ) 20 MG tablet Take 1 tablet (20 mg total) by mouth daily.   lisinopril (ZESTRIL) 20 MG tablet Take 20 mg by mouth daily.   nitroGLYCERIN  (NITROSTAT ) 0.4 MG SL tablet Place 1 tablet (0.4 mg total) under the tongue every 5 (five) minutes as needed for chest pain.   nystatin  ointment (MYCOSTATIN )  Apply 1 Application topically 2 (two) times daily.   pregabalin (LYRICA) 50 MG capsule Take 50 mg by mouth as needed.      ROS:   Please see the history of present illness.    All other systems reviewed and are negative.  EKGs       Risk Assessment/Calculations:           Physical Exam:    VS:  BP 118/70   Pulse 77   Ht 6' 2 (1.88 m)   Wt (!) 301 lb 9.6 oz (136.8 kg)   SpO2 98%   BMI 38.72 kg/m     Wt Readings from Last 3 Encounters:  02/09/24 (!) 301 lb 9.6 oz (136.8 kg)  01/19/24 (!) 308 lb 1.6 oz (139.8 kg)  01/07/24 (!) 311 lb 4.6 oz (141.2 kg)     GEN: Well nourished, well developed in no acute distress NECK: No JVD CARDIAC: irreg irreg, no murmurs, rubs, gallops RESPIRATORY:  Clear to auscultation without rales, wheezing or rhonchi  ABDOMEN: Soft, non-tender, non-distended EXTREMITIES:  No edema; No deformity.  Groin sites clear without hematoma or ecchymosis.   ASSESSMENT:    1. S/P TAVR (transcatheter aortic valve replacement)   2. Chronic heart failure with preserved ejection fraction (HFpEF) (HCC)   3. Hematuria, unspecified type   4. Hypertension associated with diabetes (HCC)    5. Hyperlipidemia associated with type 2 diabetes mellitus (HCC)   6. Persistent atrial fibrillation (HCC)     PLAN:    In order of problems listed above:  Moderate AS s/p TAVR through Progress CAP trial:  -- Echo today shows EF 65%, mild cLVH, normally functioning TAVR with a mean gradient of 6.3 mm hg and no PVL as well as mild dilatation of the ascending aorta, measuring 41 mm. -- NYHA class I symptoms. -- CCS class I symptoms. -- SBE discussed. He has amoxicillin .  -- Continue aspirin  81mg  daily (Eliquis  5mg  BID on hold currently due to hematuria).  -- I will see back for 1 year echo and OV.   Chronic HFpEF: -- Appears euvolemic.  -- Continue Lasix  20mg  daily.  Hematuria -- Has has ongoing issues with this and currently Eliquis  on hold.  -- He has follow up with urology. Dr. Debrah in [facility]- pt and wife will reach out today.   HTN: -- BP well controlled.  -- Continue lisinopril 20mg  daily.   HLD: -- Continue Repatha  140mg  q 2 weeks  Persistent (not longstanding) afib: -- New diagnosis 10/2023.  -- HR well controlled of AV Nodal blocking agents. -- He is currently off of Eliquis  and on a baby aspirin  due to recurrent hematuria associated with anemia.  He also reports worsening of his chronic lower extremity weakness when being on Eliquis . -- Left atrial appendage occluder device was discussed with him and he is very interested.  Seeing Dr. Almetta for consideration of Watchman +/- ablation. He is also interested in restoration of sinus rhythm which I think is reasonable given HFpEF with recent documentation of elevated LVEDP.  Morbid obesity: -- BMI 40. -- Continue Trulicity 4.5mg  qweekly    Medication Adjustments/Labs and Tests Ordered: Current medicines are reviewed at length with the patient today.  Concerns regarding medicines are outlined above.  Orders Placed This Encounter  Procedures   ECHOCARDIOGRAM COMPLETE   Meds ordered this encounter   Medications   nystatin  ointment (MYCOSTATIN )    Sig: Apply 1 Application topically 2 (two) times daily.  Dispense:  30 g    Refill:  3    Supervising Provider:   WONDA SHARPER [[id number]]    Patient Instructions  Medication Instructions:  Your physician recommends that you continue on your current medications as directed. Please refer to the Current Medication list given to you today.  *If you need a refill on your cardiac medications before your next appointment, please call your pharmacy*  Lab Work: None needed If you have labs (blood work) drawn today and your tests are completely normal, you will receive your results only by: MyChart Message (if you have MyChart) OR A paper copy in the mail If you have any lab test that is abnormal or we need to change your treatment, we will call you to review the results.  Testing/Procedures: 01/19/25 Your physician has requested that you have an echocardiogram. Echocardiography is a painless test that uses sound waves to create images of your heart. It provides your doctor with information about the size and shape of your heart and how well your hearts chambers and valves are working. This procedure takes approximately one hour. There are no restrictions for this procedure. Please do NOT wear cologne, perfume, aftershave, or lotions (deodorant is allowed). Please arrive 15 minutes prior to your appointment time.  Please note: We ask at that you not bring children with you during ultrasound (echo/ vascular) testing. Due to room size and safety concerns, children are not allowed in the ultrasound rooms during exams. Our front office staff cannot provide observation of children in our lobby area while testing is being conducted. An adult accompanying a patient to their appointment will only be allowed in the ultrasound room at the discretion of the ultrasound technician under special circumstances. We apologize for any inconvenience.   Follow-Up: At  [facility], you and your health needs are our priority.  As part of our continuing mission to provide you with exceptional heart care, our providers are all part of one team.  This team includes your primary Cardiologist (physician) and Advanced Practice Providers or APPs (Physician Assistants and Nurse Practitioners) who all work together to provide you with the care you need, when you need it.  Your next appointment:   As scheduled on 12/30/24  Provider:   Izetta Hummer, PA-C  We recommend signing up for the patient portal called MyChart.  Sign up information is provided on this After Visit Summary.  MyChart is used to connect with patients for Virtual Visits (Telemedicine).  Patients are able to view lab/test results, encounter notes, upcoming appointments, etc.  Non-urgent messages can be sent to your provider as well.   To learn more about what you can do with MyChart, go to [email or web address].          Signed, Lamarr Hummer, PA-C  02/09/2024 3:29 PM    [facility]

## 2024-02-09 NOTE — Patient Instructions (Signed)
 Medication Instructions:  Your physician recommends that you continue on your current medications as directed. Please refer to the Current Medication list given to you today.  *If you need a refill on your cardiac medications before your next appointment, please call your pharmacy*  Lab Work: None needed If you have labs (blood work) drawn today and your tests are completely normal, you will receive your results only by: MyChart Message (if you have MyChart) OR A paper copy in the mail If you have any lab test that is abnormal or we need to change your treatment, we will call you to review the results.  Testing/Procedures: 01/19/25 Your physician has requested that you have an echocardiogram. Echocardiography is a painless test that uses sound waves to create images of your heart. It provides your doctor with information about the size and shape of your heart and how well your hearts chambers and valves are working. This procedure takes approximately one hour. There are no restrictions for this procedure. Please do NOT wear cologne, perfume, aftershave, or lotions (deodorant is allowed). Please arrive 15 minutes prior to your appointment time.  Please note: We ask at that you not bring children with you during ultrasound (echo/ vascular) testing. Due to room size and safety concerns, children are not allowed in the ultrasound rooms during exams. Our front office staff cannot provide observation of children in our lobby area while testing is being conducted. An adult accompanying a patient to their appointment will only be allowed in the ultrasound room at the discretion of the ultrasound technician under special circumstances. We apologize for any inconvenience.   Follow-Up: At [facility], you and your health needs are our priority.  As part of our continuing mission to provide you with exceptional heart care, our providers are all part of one team.  This team includes your primary  Cardiologist (physician) and Advanced Practice Providers or APPs (Physician Assistants and Nurse Practitioners) who all work together to provide you with the care you need, when you need it.  Your next appointment:   As scheduled on 12/30/24  Provider:   Izetta Hummer, PA-C  We recommend signing up for the patient portal called MyChart.  Sign up information is provided on this After Visit Summary.  MyChart is used to connect with patients for Virtual Visits (Telemedicine).  Patients are able to view lab/test results, encounter notes, upcoming appointments, etc.  Non-urgent messages can be sent to your provider as well.   To learn more about what you can do with MyChart, go to [email or web address].

## 2024-02-09 NOTE — Research (Addendum)
°  °  30 Day Post TAVR Follow up  VS:  BP 118/70   Pulse 77   Ht 6' 2 (1.88 m)   Wt (!) 301 lb 9.6 oz (136.8 kg)   SpO2 98%   BMI 38.72 kg/m      NYHA: l  CCS: l   12 Lead EKG:      TTE: Completed 02/09/2024   Labs: See labs   KCCQ: See worksheet     [id number]: See worksheet     SF-36:  See worksheet

## 2024-02-10 ENCOUNTER — Ambulatory Visit: Payer: Self-pay | Admitting: Cardiovascular Disease

## 2024-02-10 LAB — BASIC METABOLIC PANEL WITH GFR
BUN/Creatinine Ratio: 15 (ref 10–24)
BUN: 29 mg/dL — ABNORMAL HIGH (ref 8–27)
CO2: 17 mmol/L — ABNORMAL LOW (ref 20–29)
Calcium: 9.6 mg/dL (ref 8.6–10.2)
Chloride: 101 mmol/L (ref 96–106)
Creatinine, Ser: 2 mg/dL — ABNORMAL HIGH (ref 0.76–1.27)
Glucose: 90 mg/dL (ref 70–99)
Potassium: 4.9 mmol/L (ref 3.5–5.2)
Sodium: 137 mmol/L (ref 134–144)
eGFR: 36 mL/min/1.73 — ABNORMAL LOW (ref >59)

## 2024-02-10 LAB — CBC
Hematocrit: 48.1 % (ref 37.5–51.0)
Hemoglobin: 16.1 g/dL (ref 13.0–17.7)
MCH: 29.5 pg (ref 26.6–33.0)
MCHC: 33.5 g/dL (ref 31.5–35.7)
MCV: 88 fL (ref 79–97)
Platelets: 182 x10E3/uL (ref 150–450)
RBC: 5.45 x10E6/uL (ref 4.14–5.80)
RDW: 13.2 % (ref 11.6–15.4)
WBC: 8.7 x10E3/uL (ref 3.4–10.8)

## 2024-02-27 ENCOUNTER — Encounter: Payer: Self-pay | Admitting: Student in an Organized Health Care Education/Training Program

## 2024-02-27 ENCOUNTER — Ambulatory Visit
Attending: Student in an Organized Health Care Education/Training Program | Admitting: Student in an Organized Health Care Education/Training Program

## 2024-02-27 VITALS — BP 120/80 | HR 88 | Ht 74.0 in | Wt 306.2 lb

## 2024-02-27 DIAGNOSIS — R310 Gross hematuria: Secondary | ICD-10-CM | POA: Diagnosis not present

## 2024-02-27 DIAGNOSIS — I4819 Other persistent atrial fibrillation: Secondary | ICD-10-CM | POA: Diagnosis not present

## 2024-02-27 NOTE — Patient Instructions (Signed)

## 2024-02-27 NOTE — Progress Notes (Signed)
 " Cardiology Office Note   Date: 02/27/24 ID:  Zachary Andrews, Zachary Andrews January 10, 1956, MRN [id number] PCP: Zachary Lamar CROME, MD  [facility] Providers Cardiologist:  Redell Leiter, MD Electrophysiologist:  Donnice DELENA Primus, MD  Structural Heart:  Lurena MARLA Red, MD  History of Present Illness Zachary Andrews is a 69 y.o. male with persistent AF, severe AS s/p TAVR, HLD, DM2, obesity and renal calculi with recurrent hematuria who presents for arrhythmia management and stroke risk reduction.  Discussed the use of AI scribe software for clinical note transcription with the patient, who gave verbal consent to proceed.  History of Present Illness Zachary Andrews is a 69 year old male with persistent AF who presents for evaluation of heart rhythm management.  He has a history of atrial fibrillation, previously managed with Eliquis , which led to hematuria with recurrent kidney stones. Consequently, his medication was switched to aspirin  81 mg daily, with no subsequent bleeding noted.   He also has a history of kidney stones, having undergone lithotripsy twice. The current stone is reportedly not amenable to lithotripsy. While on Eliquis , he experienced bleeding, which his kidney doctor suggested might be due to the stone moving. He has not experienced pain with the current stone, which is atypical compared to previous episodes.   His medical history includes a TAVR and carotid artery issues. He also has a history of diabetes.  No current bleeding in the urine and no pain associated with the kidney stone. He has been asymptomatic with respect to the kidney stone, which is unusual compared to past experiences.  He does have palpitations with his atrial fibrillation and generalized fatigue.  The main issue though is related to ongoing hematuria and ability to tolerate Eliquis  secondary to this.  ROS: fatigue, palpitations   Studies Reviewed  ECG review 02/09/24: AF/RVR 131, QRS 82, QT/c  298/440 01/19/24: AF/VR 81, QRS 82, QT/c 344/399 01/07/24: AF/VR 87, QRS 84, QT/c 360/433 01/06/24: AF/SVR 47, QRS 100, QT/c 446/394 01/02/24: (08:29:17) AF/VR 76, QRS 94, QT/c 348/391 01/02/24: (10:31:33) AF/VR 64, QRS 80, QT/c 372/383 11/14/23: AF/VR 71, QRS 80, QT/c 368/399 10/22/23: NSR 64, PR 202, QRS 78, QT/c 372/383  TTE Result date: 02/09/24  1. Left ventricular ejection fraction, by estimation, is 65 to 70%. The  left ventricle has normal function. The left ventricle has no regional  wall motion abnormalities. There is mild concentric left ventricular  hypertrophy. Left ventricular diastolic  function could not be evaluated.   2. Right ventricular systolic function is normal. The right ventricular  size is normal. There is normal pulmonary artery systolic pressure.   3. The mitral valve is normal in structure. No evidence of mitral valve  regurgitation. No evidence of mitral stenosis.   4. The aortic valve has been repaired/replaced. Aortic valve  regurgitation is not visualized. No aortic stenosis is present. There is a  29 mm Edwards Sapien prosthetic (TAVR) valve present in the aortic  position. Procedure Date: 01/06/2024. Echo  findings are consistent with normal structure and function of the aortic  valve prosthesis. Aortic valve area, by VTI measures 3.79 cm. Aortic  valve mean gradient measures 6.3 mmHg. Aortic valve Vmax measures 1.67  m/s.   5. Aortic dilatation noted. There is mild dilatation of the ascending  aorta, measuring 41 mm.   6. The inferior vena cava is normal in size with greater than 50%  respiratory variability, suggesting right atrial pressure of 3 mmHg.  LA and RA nl size  Risk Assessment/Calculations  CHA2DS2-VASc Score = 4  This indicates a 4.8% annual risk of stroke. The patient's score is based upon: CHF History: 0 HTN History: 1 Diabetes History: 1 Stroke History: 0 Vascular Disease History: 1 Age Score: 1 Gender Score: 0    Physical Exam VS:  BP 120/80 (BP Location: Left Arm, Patient Position: Sitting, Cuff Size: Large)   Pulse 88   Ht 6' 2 (1.88 m)   Wt (!) 306 lb 3.2 oz (138.9 kg)   SpO2 96%   BMI 39.31 kg/m   Wt Readings from Last 3 Encounters:  02/27/24 (!) 306 lb 3.2 oz (138.9 kg)  02/09/24 (!) 302 lb 6.4 oz (137.2 kg)  02/09/24 (!) 301 lb 9.6 oz (136.8 kg)    GEN: Well nourished, well developed in no acute distress NECK: No JVD; No carotid bruits CARDIAC: irregular rhythm, rate controlled, 2/6 systolic murmur at RUSB RESPIRATORY:  Clear to auscultation without rales, wheezing or rhonchi  EXTREMITIES:  No edema; No deformity   ASSESSMENT AND PLAN Zachary Andrews is a 69 y.o. male with persistent AF, severe AS s/p TAVR, HLD, DM2, obesity and renal calculi with recurrent hematuria who presents for arrhythmia management and stroke risk reduction. Assessment & Plan Persistent AF Currently in persistent AF.  We discussed indications for ablation as well as concomitant left atrial appendage occlusion with Watchman with the goal of discontinuation of OAC.  He has an appt with his urologist (Dr. Deward Stalling) on 03/12/24. Will try to reach out to Dr. Stalling so we can get a plan together to deal with his AF and associated stroke risk.  He needs to be able to tolerate 90 days of OAC for the PVI portion of the procedure and some combination of OAC or DAPT ideally for 6 months following watchman. Right now he doesn't seem to be able to tolerate OAC at all (only on it 2 weeks before hematuria).  Since he has had issues with renal stones in the past I anticipate more in the future. - Coordinate with urologist for ablation timing. - Proceed with ablation and Watchman if kidney stone managed. - Agreeable to proceed with both ablation and Watchman.  Left atrial appendage occlusion (Watchman) planning Watchman device considered for stroke prevention due to atrial fibrillation. Discussed risks of heart wall damage  and stroke during procedure. Benefits include potential discontinuation of long-term anticoagulation. - Coordinate with urologist for Watchman timing. - Proceed with Watchman and ablation if kidney stone managed. - Consider Watchman alone if kidney stone not managed.  Kidney stone with hematuria Kidney stone causing hematuria, exacerbated by anticoagulation. Asymptomatic currently. Previous bleeding with Eliquis . Timing of anticoagulation critical due to bleeding risk. - Consulted urologist for kidney stone assessment and interventions. - Proceed with anticoagulation for ablation and Watchman if kidney stone managed. - Consider alternative anticoagulation if kidney stone not managed.  Discussed treatment options today for AF including antiarrhythmic drug therapy and ablation. Discussed risks, recovery and likelihood of success with each treatment strategy. Risk, benefits, and alternatives to EP study and ablation for afib were discussed. These risks include but are not limited to stroke, bleeding, vascular damage, tamponade, perforation, damage to the esophagus, lungs, phrenic nerve and other structures, worsening renal function, coronary vasospasm and death.  Discussed potential need for repeat ablation procedures and antiarrhythmic drugs after an initial ablation. The patient understands these risk and wishes to proceed.  We will therefore proceed with catheter ablation (once we figure out what the management  of his kidney stone will be). Carto, ICE, anesthesia are requested for the procedure.   I have seen Zachary Andrews in the office today who is being considered for a Watchman left atrial appendage closure device. I believe they will benefit from this procedure given their history of atrial fibrillation, CHA2DS2-VASc score of 4 and unadjusted ischemic stroke rate of 4.8% per year. Unfortunately, the patient is not felt to be a long term anticoagulation candidate secondary to recurrent hematuria.  The patient's chart has been reviewed and I feel that they would be a candidate for short term oral anticoagulation after Watchman implant.   It is my belief that after undergoing a LAA closure procedure, Zachary Andrews will not need long term anticoagulation which eliminates anticoagulation side effects and major bleeding risk.   Procedural risks for the Watchman implant have been reviewed with the patient including a 0.5% risk of stroke, <1% risk of perforation and <1% risk of device embolization. Other risks include bleeding, vascular damage, tamponade, worsening renal function, and death. The patient understands these risk and wishes to proceed.    The published clinical data on the safety and effectiveness of WATCHMAN include but are not limited to the following: - Holmes DR, Jess BEARD, Sick P et al. for the PROTECT AF Investigators. Percutaneous closure of the left atrial appendage versus warfarin therapy for prevention of stroke in patients with atrial fibrillation: a randomised non-inferiority trial. Lancet 2009; 374: 534-42. GLENWOOD Jess BEARD, Doshi SK, Jonita VEAR Satchel D et al. on behalf of the PROTECT AF Investigators. Percutaneous Left Atrial Appendage Closure for Stroke Prophylaxis in Patients With Atrial Fibrillation 2.3-Year Follow-up of the PROTECT AF (Watchman Left Atrial Appendage System for Embolic Protection in Patients With Atrial Fibrillation) Trial. Circulation 2013; 127:720-729. - Alli O, Doshi S,  Kar S, Reddy VY, Sievert H et al. Quality of Life Assessment in the Randomized PROTECT AF (Percutaneous Closure of the Left Atrial Appendage Versus Warfarin Therapy for Prevention of Stroke in Patients With Atrial Fibrillation) Trial of Patients at Risk for Stroke With Nonvalvular Atrial Fibrillation. J Am Coll Cardiol 2013; 61:1790-8. GLENWOOD Satchel DR, Archer RAMAN, Price M, Whisenant B, Sievert H, Doshi S, Huber K, Reddy V. Prospective randomized evaluation of the Watchman left atrial appendage Device in  patients with atrial fibrillation versus long-term warfarin therapy; the PREVAIL trial. Journal of the [employer] of Cardiology, Vol. 4, No. 1, 2014, 1-11. - Kar S, Doshi SK, Sadhu A, Horton R, Osorio J et al. Primary outcome evaluation of a next-generation left atrial appendage closure device: results from the PINNACLE FLX trial. Circulation 2021;143(18)1754-1762.   After today's visit with the patient which was dedicated solely for shared decision making visit regarding LAA closure device, the patient decided to proceed with the LAA appendage closure procedure. Reviewed the CT scan already obtained and the LAA appears reasonable for closure.   HAS-BLED score 2 Hypertension No  Abnormal renal and liver function (Dialysis, transplant, Cr >2.26 mg/dL /Cirrhosis or Bilirubin >2x Normal or AST/ALT/AP >3x Normal) No  Stroke No  Bleeding Yes  Labile INR (Unstable/high INR) No  Elderly (>65) Yes  Drugs or alcohol (>= 8 drinks/week, anti-plt or NSAID) No   CHA2DS2-VASc Score = 4  The patient's score is based upon: CHF History: 0 HTN History: 1 Diabetes History: 1 Stroke History: 0 Vascular Disease History: 1 Age Score: 1 Gender Score: 0  NYHA class: I  Procedure details:  Procedure date: pending urology evaluation  CT scan  already obtained/reviewed  Hold OAC the morning of procedure (once able to start), last dose night prior  Carto/ICE/GA  Dispo: RTC post ablation+Watchman   A total of 45 minutes was spent preparing for the patient, reviewing history, performing exam, document encounter, coordinating care and counseling the patient. 30 minutes was spent with direct patient care.   Signed, Donnice DELENA Primus, MD  "

## 2024-03-13 ENCOUNTER — Other Ambulatory Visit: Payer: Self-pay | Admitting: Cardiology

## 2024-03-16 ENCOUNTER — Telehealth: Payer: Self-pay

## 2024-03-16 NOTE — Telephone Encounter (Signed)
 Left voicemail to return call. He had recent OV with urologist Dr. Deward Stalling (03/12/24). Called to get an update as patient was seen by Dr. Almetta for concomitant PVI/LAAO planning.   Spoke with medical records at Dr. Deward Coughlin's office to get recent OV note. She stated patient cancelled OV with Dr. Stalling 03/12/24. It was rescheduled to 03/29/2024. Will follow up after that appt.

## 2024-03-16 NOTE — Telephone Encounter (Signed)
 Wife returned call. Confirmed appt was moved to 2/2. Explained will follow up after that appt. She will notify if appt gets moved up.

## 2024-03-30 NOTE — Telephone Encounter (Signed)
 Spoke with patient's wife. She stated appt was cancelled yesterday due to weather and has yet to be rescheduled. She will notify when appt is. Will send contact information via MyChart per her request.

## 2024-04-02 ENCOUNTER — Telehealth: Payer: Self-pay | Admitting: Student in an Organized Health Care Education/Training Program

## 2024-04-02 NOTE — Telephone Encounter (Signed)
 Spoke with patient's wife. Patient has not been cleared by urologist yet. Wife was calling to let Watchman RN know that his appt with Dr. Terence will be 2/25 at 12:45 pm. Advised will follow up with them after the appt. Will also call Dr. Marney office after the appt to request notes be faxed over.  Wife verbalized understanding and was grateful for assistance.

## 2024-04-02 NOTE — Telephone Encounter (Signed)
 Pt's wife is calling to let Dr Elige nurse know they have the ok from his kidney doctor. Please advise

## 2024-04-20 ENCOUNTER — Ambulatory Visit: Admitting: Cardiology

## 2024-05-10 ENCOUNTER — Ambulatory Visit: Admitting: Cardiology

## 2024-12-30 ENCOUNTER — Ambulatory Visit (HOSPITAL_COMMUNITY)

## 2024-12-30 ENCOUNTER — Ambulatory Visit: Admitting: Physician Assistant
